# Patient Record
Sex: Male | Born: 1960 | Hispanic: No | Marital: Single | Smoking: Never smoker
Health system: Southern US, Community
[De-identification: ages and names within clinical notes are randomized; demographics above are authoritative.]

## PROBLEM LIST (undated history)

## (undated) DIAGNOSIS — E0590 Thyrotoxicosis, unspecified without thyrotoxic crisis or storm: Secondary | ICD-10-CM

## (undated) DIAGNOSIS — I10 Essential (primary) hypertension: Secondary | ICD-10-CM

## (undated) DIAGNOSIS — F79 Unspecified intellectual disabilities: Secondary | ICD-10-CM

## (undated) HISTORY — PX: NO PAST SURGERIES: SHX2092

---

## 2005-09-03 ENCOUNTER — Ambulatory Visit: Payer: Self-pay | Admitting: Oncology

## 2007-12-14 ENCOUNTER — Ambulatory Visit: Payer: Self-pay | Admitting: Oncology

## 2016-04-06 ENCOUNTER — Emergency Department: Payer: Medicaid Other

## 2016-04-06 ENCOUNTER — Encounter: Payer: Self-pay | Admitting: Emergency Medicine

## 2016-04-06 ENCOUNTER — Inpatient Hospital Stay
Admission: EM | Admit: 2016-04-06 | Discharge: 2016-04-07 | DRG: 872 | Disposition: A | Discharge status: Home / Self Care | Payer: Medicaid Other | Attending: Internal Medicine | Admitting: Internal Medicine

## 2016-04-06 DIAGNOSIS — F79 Unspecified intellectual disabilities: Secondary | ICD-10-CM | POA: Diagnosis present

## 2016-04-06 DIAGNOSIS — L03115 Cellulitis of right lower limb: Secondary | ICD-10-CM | POA: Diagnosis present

## 2016-04-06 DIAGNOSIS — A419 Sepsis, unspecified organism: Principal | ICD-10-CM | POA: Diagnosis present

## 2016-04-06 DIAGNOSIS — L03116 Cellulitis of left lower limb: Secondary | ICD-10-CM | POA: Diagnosis present

## 2016-04-06 DIAGNOSIS — Z79899 Other long term (current) drug therapy: Secondary | ICD-10-CM

## 2016-04-06 DIAGNOSIS — E872 Acidosis: Secondary | ICD-10-CM | POA: Diagnosis present

## 2016-04-06 DIAGNOSIS — I10 Essential (primary) hypertension: Secondary | ICD-10-CM | POA: Diagnosis present

## 2016-04-06 DIAGNOSIS — Z91013 Allergy to seafood: Secondary | ICD-10-CM | POA: Diagnosis not present

## 2016-04-06 DIAGNOSIS — E871 Hypo-osmolality and hyponatremia: Secondary | ICD-10-CM | POA: Diagnosis present

## 2016-04-06 DIAGNOSIS — M7989 Other specified soft tissue disorders: Secondary | ICD-10-CM | POA: Diagnosis not present

## 2016-04-06 DIAGNOSIS — L03119 Cellulitis of unspecified part of limb: Secondary | ICD-10-CM

## 2016-04-06 DIAGNOSIS — R7989 Other specified abnormal findings of blood chemistry: Secondary | ICD-10-CM | POA: Diagnosis present

## 2016-04-06 HISTORY — DX: Unspecified intellectual disabilities: F79

## 2016-04-06 HISTORY — DX: Essential (primary) hypertension: I10

## 2016-04-06 LAB — URINALYSIS, ROUTINE W REFLEX MICROSCOPIC
Bilirubin Urine: NEGATIVE
Glucose, UA: NEGATIVE mg/dL
Hgb urine dipstick: NEGATIVE
Ketones, ur: 20 mg/dL — AB
LEUKOCYTES UA: NEGATIVE
Nitrite: NEGATIVE
PROTEIN: NEGATIVE mg/dL
Specific Gravity, Urine: 1.015 (ref 1.005–1.030)
pH: 6 (ref 5.0–8.0)

## 2016-04-06 LAB — COMPREHENSIVE METABOLIC PANEL
ALT: 17 U/L (ref 17–63)
AST: 34 U/L (ref 15–41)
Albumin: 3.7 g/dL (ref 3.5–5.0)
Alkaline Phosphatase: 129 U/L — ABNORMAL HIGH (ref 38–126)
Anion gap: 12 (ref 5–15)
BUN: 16 mg/dL (ref 6–20)
CO2: 24 mmol/L (ref 22–32)
CREATININE: 1.12 mg/dL (ref 0.61–1.24)
Calcium: 9.1 mg/dL (ref 8.9–10.3)
Chloride: 96 mmol/L — ABNORMAL LOW (ref 101–111)
GFR calc Af Amer: >60 mL/min (ref >60)
Glucose, Bld: 163 mg/dL — ABNORMAL HIGH (ref 65–99)
POTASSIUM: 3.5 mmol/L (ref 3.5–5.1)
Sodium: 132 mmol/L — ABNORMAL LOW (ref 135–145)
TOTAL PROTEIN: 8.2 g/dL — AB (ref 6.5–8.1)
Total Bilirubin: 1 mg/dL (ref 0.3–1.2)

## 2016-04-06 LAB — CBC WITH DIFFERENTIAL/PLATELET
BASOS ABS: 0.1 10*3/uL (ref 0–0.1)
Basophils Relative: 0 %
EOS ABS: 1.4 10*3/uL — AB (ref 0–0.7)
EOS PCT: 9 %
HCT: 35 % — ABNORMAL LOW (ref 40.0–52.0)
Hemoglobin: 11.8 g/dL — ABNORMAL LOW (ref 13.0–18.0)
LYMPHS PCT: 8 %
Lymphs Abs: 1.3 10*3/uL (ref 1.0–3.6)
MCH: 26.7 pg (ref 26.0–34.0)
MCHC: 33.6 g/dL (ref 32.0–36.0)
MCV: 79.4 fL — ABNORMAL LOW (ref 80.0–100.0)
Monocytes Absolute: 1.7 10*3/uL — ABNORMAL HIGH (ref 0.2–1.0)
Monocytes Relative: 10 %
Neutro Abs: 11.8 10*3/uL — ABNORMAL HIGH (ref 1.4–6.5)
Neutrophils Relative %: 73 %
PLATELETS: 754 10*3/uL — AB (ref 150–440)
RBC: 4.41 MIL/uL (ref 4.40–5.90)
RDW: 14.6 % — ABNORMAL HIGH (ref 11.5–14.5)
WBC: 16.3 10*3/uL — AB (ref 3.8–10.6)

## 2016-04-06 LAB — LACTIC ACID, PLASMA
Lactic Acid, Venous: 3.1 mmol/L (ref 0.5–1.9)
Lactic Acid, Venous: 1.7 mmol/L (ref 0.5–1.9)

## 2016-04-06 LAB — APTT: aPTT: 34 seconds (ref 24–36)

## 2016-04-06 LAB — PROCALCITONIN

## 2016-04-06 LAB — BRAIN NATRIURETIC PEPTIDE: B NATRIURETIC PEPTIDE 5: 13 pg/mL (ref 0.0–100.0)

## 2016-04-06 LAB — PROTIME-INR
INR: 1.13
PROTHROMBIN TIME: 14.6 s (ref 11.4–15.2)

## 2016-04-06 LAB — TROPONIN I

## 2016-04-06 MED ORDER — ALBUTEROL SULFATE (2.5 MG/3ML) 0.083% IN NEBU: 2.5000 mg | INHALATION_SOLUTION | RESPIRATORY_TRACT | Status: DC | PRN

## 2016-04-06 MED ORDER — PIPERACILLIN-TAZOBACTAM 3.375 G IVPB
3.3750 g | Freq: Three times a day (TID) | INTRAVENOUS | Status: DC
  Administered 2016-04-06 – 2016-04-07 (×2): 3.375 g via INTRAVENOUS
  Filled 2016-04-06 (×2): qty 50

## 2016-04-06 MED ORDER — BISACODYL 5 MG PO TBEC: 5.0000 mg | DELAYED_RELEASE_TABLET | Freq: Every day | ORAL | Status: DC | PRN

## 2016-04-06 MED ORDER — AMLODIPINE BESYLATE 10 MG PO TABS
10.0000 mg | ORAL_TABLET | Freq: Every day | ORAL | Status: DC
  Administered 2016-04-07: 10 mg via ORAL
  Filled 2016-04-06: qty 1

## 2016-04-06 MED ORDER — SODIUM CHLORIDE 0.9 % IV SOLN
INTRAVENOUS | Status: DC
  Administered 2016-04-06: 1000 mL via INTRAVENOUS

## 2016-04-06 MED ORDER — PIPERACILLIN-TAZOBACTAM 3.375 G IVPB 30 MIN
3.3750 g | Freq: Once | INTRAVENOUS | Status: AC
  Administered 2016-04-06: 3.375 g via INTRAVENOUS
  Filled 2016-04-06: qty 50

## 2016-04-06 MED ORDER — HYDROCODONE-ACETAMINOPHEN 5-325 MG PO TABS: 1.0000 | ORAL_TABLET | ORAL | Status: DC | PRN

## 2016-04-06 MED ORDER — ONDANSETRON HCL 4 MG PO TABS: 4.0000 mg | ORAL_TABLET | Freq: Four times a day (QID) | ORAL | Status: DC | PRN

## 2016-04-06 MED ORDER — ENOXAPARIN SODIUM 40 MG/0.4ML ~~LOC~~ SOLN
40.0000 mg | Freq: Two times a day (BID) | SUBCUTANEOUS | Status: DC
  Administered 2016-04-06 – 2016-04-07 (×2): 40 mg via SUBCUTANEOUS
  Filled 2016-04-06 (×2): qty 0.4

## 2016-04-06 MED ORDER — ONDANSETRON HCL 4 MG/2ML IJ SOLN: 4.0000 mg | Freq: Four times a day (QID) | INTRAMUSCULAR | Status: DC | PRN

## 2016-04-06 MED ORDER — ACETAMINOPHEN 325 MG PO TABS: 650.0000 mg | ORAL_TABLET | Freq: Four times a day (QID) | ORAL | Status: DC | PRN

## 2016-04-06 MED ORDER — SODIUM CHLORIDE 0.9 % IV BOLUS (SEPSIS)
1000.0000 mL | Freq: Once | INTRAVENOUS | Status: AC
  Administered 2016-04-06: 1000 mL via INTRAVENOUS

## 2016-04-06 MED ORDER — SENNOSIDES-DOCUSATE SODIUM 8.6-50 MG PO TABS: 1.0000 | ORAL_TABLET | Freq: Every evening | ORAL | Status: DC | PRN

## 2016-04-06 MED ORDER — ACETAMINOPHEN 650 MG RE SUPP: 650.0000 mg | Freq: Four times a day (QID) | RECTAL | Status: DC | PRN

## 2016-04-06 MED ORDER — VANCOMYCIN HCL IN DEXTROSE 1-5 GM/200ML-% IV SOLN
1000.0000 mg | Freq: Once | INTRAVENOUS | Status: AC
  Administered 2016-04-06: 1000 mg via INTRAVENOUS
  Filled 2016-04-06: qty 200

## 2016-04-06 MED ORDER — VANCOMYCIN HCL 10 G IV SOLR
1250.0000 mg | Freq: Two times a day (BID) | INTRAVENOUS | Status: DC
  Administered 2016-04-07: 1250 mg via INTRAVENOUS
  Filled 2016-04-06 (×5): qty 1250

## 2016-04-06 MED ORDER — LISINOPRIL 20 MG PO TABS
40.0000 mg | ORAL_TABLET | Freq: Every day | ORAL | Status: DC
  Administered 2016-04-07: 40 mg via ORAL
  Filled 2016-04-06: qty 2

## 2016-04-06 MED ORDER — KETOROLAC TROMETHAMINE 30 MG/ML IJ SOLN: 30.0000 mg | Freq: Four times a day (QID) | INTRAMUSCULAR | Status: DC | PRN

## 2016-04-06 NOTE — Progress Notes (Signed)
PHARMACIST - PHYSICIAN COMMUNICATION  CONCERNING:  Enoxaparin (Lovenox) for DVT Prophylaxis    RECOMMENDATION: Patient was prescribed enoxaprin 40mg  q24 hours for VTE prophylaxis.   Filed Weights   04/06/16 1639  Weight: 286 lb (129.7 kg)    Body mass index is 47.59 kg/m.  Estimated Creatinine Clearance: 94.7 mL/min (by C-G formula based on SCr of 1.12 mg/dL).   [facility] patient is candidate for enoxaparin 40mg  every 12 hour dosing due to BMI being >40.  DESCRIPTION: Pharmacy has adjusted enoxaparin dose per [facility] policy.  Patient is now receiving enoxaparin 40mg  every 12 hours.   Cher NakaiSheema Elisama Thissen, PharmD Clinical Pharmacist  04/06/2016 9:23 PM

## 2016-04-06 NOTE — Progress Notes (Signed)
Pharmacy Antibiotic Note  Joel Norton is a 56 y.o. male admitted on 04/06/2016 with sepsis and cellulitis.  Pharmacy has been consulted for Vancomycin and Zosyn dosing. Patient received Vancomycin 1gm IV and Zosyn 3.375 IV x1 dose in ED.   Plan: Ke: 0.082   VD: 61.6   T1/2: 8.5   DW: 88kg  Will start the patient on Vancomycin 1250mg  IV every 12 hours with 6 hours stack dosing. Trough prior to 5th dose.  Calculated trough at Css is 14.  Will start patient on Zosyn 3.375 IV EI every 8 hours.   Height: 5\' 5"  (165.1 cm) Weight: 286 lb (129.7 kg) IBW/kg (Calculated) : 61.5  Temp (24hrs), Avg:98.1 F (36.7 C), Min:98 F (36.7 C), Max:98.2 F (36.8 C)   Recent Labs Lab 04/06/16 1648 04/06/16 1959  WBC 16.3*  --   CREATININE 1.12  --   LATICACIDVEN 3.1* 1.7    Estimated Creatinine Clearance: 94.7 mL/min (by C-G formula based on SCr of 1.12 mg/dL).    Allergies  Allergen Reactions  . Shellfish Allergy     Antimicrobials this admission: 0114 vancomycin >>  0114 Zosyn  >>   Dose adjustments this admission:   Microbiology results: 0114 BCx: sent 0114 UCx: sent   Thank you for allowing pharmacy to be a part of this patient's care.  Gardner CandleSheema M Rosely Fernandez, PharmD, BCPS Clinical Pharmacist 04/06/2016 9:30 PM

## 2016-04-06 NOTE — H&P (Addendum)
[facility] [place] [facility]   PATIENT NAME: Joel Norton    MR#:  [id number]  DATE OF BIRTH:  1960-09-30  DATE OF ADMISSION:  04/06/2016  PRIMARY CARE PHYSICIAN: Lanier Ensign KEY, MD   REQUESTING/REFERRING PHYSICIAN: Minna Antis, MD  CHIEF COMPLAINT:  No chief complaint on file.  Bilateral leg redness and swelling. HISTORY OF PRESENT ILLNESS:  Collins Kerby  is a 56 y.o. male with a known history of Hypertension and mental retardation. The patient was sent from home to ED due to above chief complaints. Patient has mental retardation, unable to provide information on review of system. According to his niece, POA, she noticed bilateral leg swelling and redness today. But no fever or chills. Patient was found tachycardia at 150 and leukocytosis at 16,300 in the ED. He is treated with vancomycin and Zosyn.  PAST MEDICAL HISTORY:   Past Medical History:  Diagnosis Date  . Hypertension   . Mental retardation     PAST SURGICAL HISTORY:  History reviewed. No pertinent surgical history. Unable to obtain due to patient's mental status. SOCIAL HISTORY:   Social History  Substance Use Topics  . Smoking status: Never Smoker  . Smokeless tobacco: Never Used  . Alcohol use No    FAMILY HISTORY:  History reviewed. No pertinent family history. Unable to obtain due to patient's mental status.  DRUG ALLERGIES:   Allergies  Allergen Reactions  . Shellfish Allergy     REVIEW OF SYSTEMS:   Review of Systems  Unable to perform ROS: Mental acuity    MEDICATIONS AT HOME:   Prior to Admission medications   Medication Sig Start Date End Date Taking? Authorizing Provider  amLODipine (NORVASC) 10 MG tablet Take 10 mg by mouth daily.   Yes Historical Provider, MD  hydrochlorothiazide (HYDRODIURIL) 25 MG tablet Take 25 mg by mouth daily.   Yes Historical Provider, MD  lisinopril (PRINIVIL,ZESTRIL) 40 MG tablet Take 40 mg by mouth daily.   Yes Historical  Provider, MD  Multiple Vitamin (MULTIVITAMIN) tablet Take 1 tablet by mouth daily.   Yes Historical Provider, MD      VITAL SIGNS:  Blood pressure (!) 133/51, pulse (!) 137, temperature 98.2 F (36.8 C), temperature source Oral, resp. rate (!) 26, height 5\' 5"  (1.651 m), weight 286 lb (129.7 kg), SpO2 100 %.  PHYSICAL EXAMINATION:  Physical Exam  GENERAL:  56 y.o.-year-old patient lying in the bed with no acute distress. Morbidly obese. EYES: Pupils equal, round, reactive to light and accommodation. No scleral icterus. Extraocular muscles intact.  HEENT: Head atraumatic, normocephalic. Oropharynx and nasopharynx clear.  NECK:  Supple, no jugular venous distention. No thyroid enlargement, no tenderness.  LUNGS: Normal breath sounds bilaterally, no wheezing, rales,rhonchi or crepitation. No use of accessory muscles of respiration.  CARDIOVASCULAR: S1, S2 normal. No murmurs, rubs, or gallops.  ABDOMEN: Soft, nontender, nondistended. Bowel sounds present. No organomegaly or mass.  EXTREMITIES: Bilateral leg redness and erythema under knees. No cyanosis, or clubbing.  NEUROLOGIC: Cranial nerves II through XII are intact. Muscle strength 5/5 in all extremities. Sensation intact. Gait not checked.  PSYCHIATRIC: The patient is demented. SKIN: No obvious rash, lesion, or ulcer.   LABORATORY PANEL:   CBC  Recent Labs Lab 04/06/16 1648  WBC 16.3*  HGB 11.8*  HCT 35.0*  PLT 754*   ------------------------------------------------------------------------------------------------------------------  Chemistries   Recent Labs Lab 04/06/16 1648  NA 132*  K 3.5  CL 96*  CO2 24  GLUCOSE  163*  BUN 16  CREATININE 1.12  CALCIUM 9.1  AST 34  ALT 17  ALKPHOS 129*  BILITOT 1.0   ------------------------------------------------------------------------------------------------------------------  Cardiac Enzymes  Recent Labs Lab 04/06/16 1648  TROPONINI <0.03    ------------------------------------------------------------------------------------------------------------------  RADIOLOGY:  [place] Venous Img Lower Bilateral  Result Date: 04/06/2016 CLINICAL DATA:  Redness and swelling in BILATERAL lower extremities for 1 day EXAM: BILATERAL LOWER EXTREMITY VENOUS DOPPLER ULTRASOUND TECHNIQUE: Gray-scale sonography with graded compression, as well as color Doppler and duplex ultrasound were performed to evaluate the lower extremity deep venous systems from the level of the common femoral vein and including the common femoral, femoral, profunda femoral, popliteal and calf veins including the posterior tibial, peroneal and gastrocnemius veins when visible. The superficial great saphenous vein was also interrogated. Spectral Doppler was utilized to evaluate flow at rest and with distal augmentation maneuvers in the common femoral, femoral and popliteal veins. COMPARISON:  None FINDINGS: RIGHT LOWER EXTREMITY Common Femoral Vein: No evidence of thrombus. Normal compressibility, respiratory phasicity and response to augmentation. Saphenofemoral Junction: No evidence of thrombus. Normal compressibility and flow on color Doppler imaging. Profunda Femoral Vein: No evidence of thrombus. Normal compressibility and flow on color Doppler imaging. Femoral Vein: No evidence of thrombus. Normal compressibility, respiratory phasicity and response to augmentation. Popliteal Vein: No evidence of thrombus. Normal compressibility, respiratory phasicity and response to augmentation. Calf Veins: No evidence of thrombus. Normal compressibility and flow on color Doppler imaging. Superficial Great Saphenous Vein: No evidence of thrombus. Normal compressibility and flow on color Doppler imaging. Venous Reflux:  None. Other Findings: RIGHT inguinal lymph nodes measuring 1.6 cm and 1.3 cm in short axis. LEFT LOWER EXTREMITY Common Femoral Vein: No evidence of thrombus. Normal compressibility,  respiratory phasicity and response to augmentation. Saphenofemoral Junction: No evidence of thrombus. Normal compressibility and flow on color Doppler imaging. Profunda Femoral Vein: No evidence of thrombus. Normal compressibility and flow on color Doppler imaging. Femoral Vein: No evidence of thrombus. Normal compressibility, respiratory phasicity and response to augmentation. Popliteal Vein: No evidence of thrombus. Normal compressibility, respiratory phasicity and response to augmentation. Calf Veins: Limited visualization of LEFT peroneal veins. Posterior tibial veins visualized. No evidence of thrombus. Normal compressibility and flow on color Doppler imaging. Superficial Great Saphenous Vein: No evidence of thrombus. Normal compressibility and flow on color Doppler imaging. Venous Reflux:  None. Other Findings: LEFT inguinal lymph nodes measuring 1.2 cm and 1.1 cm in short axis. IMPRESSION: No evidence of deep venous thrombosis in either lower extremity. Upper normal sized RIGHT inguinal lymph node. Electronically Signed   By: Ulyses SouthwardMark  Boles M.D.   On: 04/06/2016 17:56   Dg Chest Port 1 View  Result Date: 04/06/2016 CLINICAL DATA:  Bilateral lower limb swelling starting today. EXAM: PORTABLE CHEST 1 VIEW COMPARISON:  None. FINDINGS: 1655 hours. The lungs are clear wiithout focal pneumonia, edema, pneumothorax or pleural effusion. Cardiopericardial silhouette is at upper limits of normal for size. The visualized bony structures of the thorax are intact. Telemetry leads overlie the chest. IMPRESSION: No active disease. Electronically Signed   By: Kennith CenterEric  Mansell M.D.   On: 04/06/2016 17:11      IMPRESSION AND PLAN:   Sepsis due to bilateral leg cellulitis The patient will be admitted to medical floor. He is treated with vancomycin and Zosyn. Continue antibiotics, follow-up CBC and blood culture.  Lactic acidosis. Continue antibiotics and IV fluid support, follow-up lactic acid. Thrombocytosis. Due to  reaction. Follow-up CBC. Hyponatremia. Start normal saline IV and  follow-up BMP. Hypertension. Continue lisinopril and Norvasc, hold HCTZ due to hyponatremia.  All the records are reviewed and case discussed with ED provider. Management plans discussed with the patient, His niece and they are in agreement.  CODE STATUS: Full code  TOTAL TIME TAKING CARE OF THIS PATIENT: 53 minutes.    Shaune Pollack M.D on 04/06/2016 at 7:03 PM  Between 7am to 6pm - Pager - [phone]  After 6pm go to [email or web address] - Social research officer, government  [facility]  Office  [phone]  CC: Primary care physician; Lanier Ensign KEY, MD   Note: This dictation was prepared with Dragon dictation along with smaller phrase technology. Any transcriptional errors that result from this process are unintentional.

## 2016-04-06 NOTE — ED Provider Notes (Signed)
[facility] Emergency Department Provider Note  Time seen: 4:48 PM  I have reviewed the triage vital signs and the nursing notes.   HISTORY  Chief Complaint No chief complaint on file.    HPI Joel Norton is a 56 y.o. male with a past medical history of hypertension, decreased mental capacity, his niece who is the patient's primary caregiver is here with the patient who reports leg swelling and redness. According to the niece they just noticed the redness today but is not entirely positive how long the redness has been present. Patient's heart rate was 127 home, 150 in the emergency department. Denies any fever. Patient denies any complaints at this time.  Past Medical History:  Diagnosis Date  . Hypertension     There are no active problems to display for this patient.   History reviewed. No pertinent surgical history.  Prior to Admission medications   Not on File    Allergies  Allergen Reactions  . Shellfish Allergy     History reviewed. No pertinent family history.  Social History Social History  Substance Use Topics  . Smoking status: Never Smoker  . Smokeless tobacco: Never Used  . Alcohol use No    Review of Systems Per patient and niece. Constitutional: Negative for fever Cardiovascular: Negative for chest pain. Respiratory: Negative for shortness of breath. Gastrointestinal: Negative for abdominal pain, vomiting and diarrhea. Skin: Rash to lower legs bilaterally. Neurological: Negative for headache 10-point ROS otherwise negative.  ____________________________________________   PHYSICAL EXAM:  VITAL SIGNS: ED Triage Vitals  Enc Vitals Group     BP 04/06/16 1630 136/83     Pulse Rate 04/06/16 1630 (!) 153     Resp 04/06/16 1630 18     Temp 04/06/16 1630 98.2 F (36.8 C)     Temp Source 04/06/16 1630 Oral     SpO2 04/06/16 1630 100 %     Weight 04/06/16 1639 286 lb (129.7 kg)     Height 04/06/16 1639 5\' 3"  (1.6 m)    Head Circumference --      Peak Flow --      Pain Score --      Pain Loc --      Pain Edu? --      Excl. in GC? --     Constitutional: Alert. Well appearing and in no distress. Eyes: Normal exam ENT   Head: Normocephalic and atraumatic.   Mouth/Throat: Mucous membranes are moist. Cardiovascular: Regular rhythm around 150 bpm. Respiratory: Normal respiratory effort without tachypnea nor retractions. Breath sounds are clear  Gastrointestinal: Soft and nontender. No distention.   Musculoskeletal: 2+ lower extremity edema, equal bilaterally with significant erythema bilaterally. No tenderness to palpation. Appears to be neurovascular intact. Neurologic:  Normal speech and language. No gross focal neurologic deficits  Skin:  Skin is warm, dry and intact. Erythema bilateral lower extremities as noted above. Patient also has rash to his upper extremities and upper back which appeared to be possible bite marks (possible scabies, bedbugs) Psychiatric: Mood and affect are normal.   ____________________________________________    EKG  EKG reviewed and interpreted by myself shows sinus tachycardia 144 bpm. Narrow QRS, normal axis, normal intervals and nonspecific ST changes but no ST elevations.  ____________________________________________    RADIOLOGY  Ultrasound negative for DVT  ____________________________________________   INITIAL IMPRESSION / ASSESSMENT AND PLAN / ED COURSE  Pertinent labs & imaging results that were available during my care of the patient were reviewed  by me and considered in my medical decision making (see chart for details).  Patient presents the emergency department with bilateral lower extremity edema and erythema likely cellulitis we'll obtain an ultrasound to rule out DVTs. Patient is quite tachycardic at 153 bpm. I have ordered sepsis protocols, including troponin and BNP. We'll continue to closely monitor in the emergency department while  awaiting all results.  Patient is results show a lactate of 3.9. Ultrasounds are negative for DVT. Highly suspect cellulitis. White blood cell count of 16,000. Positive for sepsis criteria. We are treating with empiric anabiotic small admit to the hospitalist team for further treatment.  CRITICAL CARE Performed by: Minna AntisPADUCHOWSKI, Ninamarie Keel   Total critical care time: 30 minutes  Critical care time was exclusive of separately billable procedures and treating other patients.  Critical care was necessary to treat or prevent imminent or life-threatening deterioration.  Critical care was time spent personally by me on the following activities: development of treatment plan with patient and/or surrogate as well as nursing, discussions with consultants, evaluation of patient's response to treatment, examination of patient, obtaining history from patient or surrogate, ordering and performing treatments and interventions, ordering and review of laboratory studies, ordering and review of radiographic studies, pulse oximetry and re-evaluation of patient's condition.   ____________________________________________   FINAL CLINICAL IMPRESSION(S) / ED DIAGNOSES  Cellulitis Sepsis   Minna AntisKevin Imari Reen, MD 04/06/16 1840

## 2016-04-06 NOTE — ED Triage Notes (Signed)
Pt noticed cellulitis and swelling  bi-lateral legs. Per niece who is his caregiver his HR at home was in the 120's. Pt has hx of high BP.

## 2016-04-06 NOTE — ED Notes (Addendum)
FIRST NURSE NOTE: Swelling noted to both legs that started today.  Pt recently started on new medication in November.  Pt has some developmental disabilities and is accompanied by his niece. Who is his guardian.

## 2016-04-06 NOTE — ED Notes (Signed)
Patient transported to X-ray 

## 2016-04-07 LAB — BASIC METABOLIC PANEL
Anion gap: 10 (ref 5–15)
BUN: 16 mg/dL (ref 6–20)
CALCIUM: 8.5 mg/dL — AB (ref 8.9–10.3)
CHLORIDE: 99 mmol/L — AB (ref 101–111)
CO2: 27 mmol/L (ref 22–32)
CREATININE: 1.19 mg/dL (ref 0.61–1.24)
Glucose, Bld: 114 mg/dL — ABNORMAL HIGH (ref 65–99)
Potassium: 3.5 mmol/L (ref 3.5–5.1)
SODIUM: 136 mmol/L (ref 135–145)

## 2016-04-07 LAB — CBC
HCT: 30.4 % — ABNORMAL LOW (ref 40.0–52.0)
Hemoglobin: 10.4 g/dL — ABNORMAL LOW (ref 13.0–18.0)
MCH: 27.3 pg (ref 26.0–34.0)
MCHC: 34.4 g/dL (ref 32.0–36.0)
MCV: 79.5 fL — AB (ref 80.0–100.0)
PLATELETS: 566 10*3/uL — AB (ref 150–440)
RBC: 3.82 MIL/uL — AB (ref 4.40–5.90)
RDW: 14.1 % (ref 11.5–14.5)
WBC: 11.7 10*3/uL — ABNORMAL HIGH (ref 3.8–10.6)

## 2016-04-07 MED ORDER — CEPHALEXIN 500 MG PO CAPS: 500.0000 mg | ORAL_CAPSULE | Freq: Two times a day (BID) | ORAL | 0 refills | Status: DC

## 2016-04-07 NOTE — Progress Notes (Signed)
Discharge instructions and prescriptions given with verbalized understanding from caregiver the niece.  IV removed per policy and procedure. Patient taken out via wheelchair by nurse to be taken home in personal vehicle by family member.

## 2016-04-07 NOTE — Discharge Instructions (Signed)
Cellulitis, Adult °Introduction °Cellulitis is a skin infection. The infected area is usually red and sore. This condition occurs most often in the arms and lower legs. It is very important to get treated for this condition. °Follow these instructions at home: °· Take over-the-counter and prescription medicines only as told by your doctor. °· If you were prescribed an antibiotic medicine, take it as told by your doctor. Do not stop taking the antibiotic even if you start to feel better. °· Drink enough fluid to keep your pee (urine) clear or pale yellow. °· Do not touch or rub the infected area. °· Raise (elevate) the infected area above the level of your heart while you are sitting or lying down. °· Place warm or cold wet cloths (warm or cold compresses) on the infected area. Do this as told by your doctor. °· Keep all follow-up visits as told by your doctor. This is important. These visits let your doctor make sure your infection is not getting worse. °Contact a doctor if: °· You have a fever. °· Your symptoms do not get better after 1-2 days of treatment. °· Your bone or joint under the infected area starts to hurt after the skin has healed. °· Your infection comes back. This can happen in the same area or another area. °· You have a swollen bump in the infected area. °· You have new symptoms. °· You feel ill and also have muscle aches and pains. °Get help right away if: °· Your symptoms get worse. °· You feel very sleepy. °· You throw up (vomit) or have watery poop (diarrhea) for a long time. °· There are red streaks coming from the infected area. °· Your red area gets larger. °· Your red area turns darker. °This information is not intended to replace advice given to you by your health care provider. Make sure you discuss any questions you have with your health care provider. °Document Released: 08/27/2007 Document Revised: 08/16/2015 Document Reviewed: 01/17/2015 °© 2017 [employer] ° °

## 2016-04-08 LAB — URINE CULTURE: CULTURE: NO GROWTH

## 2016-04-09 NOTE — Discharge Summary (Signed)
[facility] - [place] at [facility]   PATIENT NAME: Joel Norton    MR#:  [id number]  DATE OF BIRTH:  Jan 20, 1961  DATE OF ADMISSION:  04/06/2016   ADMITTING PHYSICIAN: Shaune Pollack, MD  DATE OF DISCHARGE: 04/07/2016 10:56 AM  PRIMARY CARE PHYSICIAN: SOLES, MEREDITH KEY, MD   ADMISSION DIAGNOSIS:  Cellulitis of lower extremity, unspecified laterality [L03.119] Sepsis, due to unspecified organism (HCC) [A41.9] DISCHARGE DIAGNOSIS:  Active Problems:   Sepsis (HCC)  SECONDARY DIAGNOSIS:   Past Medical History:  Diagnosis Date  . Hypertension   . Mental retardation    HOSPITAL COURSE:  56 y.o. male with a known history of Hypertension and mental retardation  Sepsis due to bilateral leg cellulitis - present on admission Resolved with abx  Lactic acidosis: resolved with treatment Thrombocytosis. stable  Hyponatremia: improved with hydration, held HCTZ Hypertension. Continue lisinopril and Norvasc, hold HCTZ due to hyponatremia.  DISCHARGE CONDITIONS:  STABLE CONSULTS OBTAINED:   DRUG ALLERGIES:   Allergies  Allergen Reactions  . Shellfish Allergy    DISCHARGE MEDICATIONS:   Allergies as of 04/07/2016      Reactions   Shellfish Allergy       Medication List    TAKE these medications   amLODipine 10 MG tablet Commonly known as:  NORVASC Take 10 mg by mouth daily.   cephALEXin 500 MG capsule Commonly known as:  KEFLEX Take 1 capsule (500 mg total) by mouth 2 (two) times daily.   hydrochlorothiazide 25 MG tablet Commonly known as:  HYDRODIURIL Take 25 mg by mouth daily.   lisinopril 40 MG tablet Commonly known as:  PRINIVIL,ZESTRIL Take 40 mg by mouth daily.   multivitamin tablet Take 1 tablet by mouth daily.        DISCHARGE INSTRUCTIONS:   DIET:  Regular diet DISCHARGE CONDITION:  Good ACTIVITY:  Activity as tolerated OXYGEN:  Home Oxygen: Yes.    Oxygen Delivery: room air DISCHARGE LOCATION:  home   If you experience  worsening of your admission symptoms, develop shortness of breath, life threatening emergency, suicidal or homicidal thoughts you must seek medical attention immediately by calling 911 or calling your MD immediately  if symptoms less severe.  You Must read complete instructions/literature along with all the possible adverse reactions/side effects for all the Medicines you take and that have been prescribed to you. Take any new Medicines after you have completely understood and accpet all the possible adverse reactions/side effects.   Please note  You were cared for by a hospitalist during your hospital stay. If you have any questions about your discharge medications or the care you received while you were in the hospital after you are discharged, you can call the unit and asked to speak with the hospitalist on call if the hospitalist that took care of you is not available. Once you are discharged, your primary care physician will handle any further medical issues. Please note that NO REFILLS for any discharge medications will be authorized once you are discharged, as it is imperative that you return to your primary care physician (or establish a relationship with a primary care physician if you do not have one) for your aftercare needs so that they can reassess your need for medications and monitor your lab values.    On the day of Discharge:  VITAL SIGNS:  Blood pressure 130/64, pulse (!) 111, temperature 98.4 F (36.9 C), temperature source Oral, resp. rate 17, height 5\' 5"  (1.651 m), weight 129.7  kg (286 lb), SpO2 100 %. PHYSICAL EXAMINATION:  GENERAL:  56 y.o.-year-old patient lying in the bed with no acute distress.  EYES: Pupils equal, round, reactive to light and accommodation. No scleral icterus. Extraocular muscles intact.  HEENT: Head atraumatic, normocephalic. Oropharynx and nasopharynx clear.  NECK:  Supple, no jugular venous distention. No thyroid enlargement, no tenderness.  LUNGS:  Normal breath sounds bilaterally, no wheezing, rales,rhonchi or crepitation. No use of accessory muscles of respiration.  CARDIOVASCULAR: S1, S2 normal. No murmurs, rubs, or gallops.  ABDOMEN: Soft, non-tender, non-distended. Bowel sounds present. No organomegaly or mass.  EXTREMITIES: No pedal edema, cyanosis, or clubbing.  NEUROLOGIC: Cranial nerves II through XII are intact. Muscle strength 5/5 in all extremities. Sensation intact. Gait not checked.  PSYCHIATRIC: The patient is alert and oriented x 3.  SKIN: No obvious rash, lesion, or ulcer.  DATA REVIEW:   CBC  Recent Labs Lab 04/07/16 0320  WBC 11.7*  HGB 10.4*  HCT 30.4*  PLT 566*    Chemistries   Recent Labs Lab 04/06/16 1648 04/07/16 0320  NA 132* 136  K 3.5 3.5  CL 96* 99*  CO2 24 27  GLUCOSE 163* 114*  BUN 16 16  CREATININE 1.12 1.19  CALCIUM 9.1 8.5*  AST 34  --   ALT 17  --   ALKPHOS 129*  --   BILITOT 1.0  --     Follow-up Information    SOLES, MEREDITH KEY, MD. Schedule an appointment as soon as possible for a visit in 1 week(s).   Specialty:  Family Medicine Contact information: [address] [place] [address] [phone]           Management plans discussed with the patient, family and they are in agreement.  CODE STATUS: FULL CODE  TOTAL TIME TAKING CARE OF THIS PATIENT: 45 minutes.    Delfino LovettVipul Betzalel Umbarger M.D on 04/09/2016 at 10:01 PM  Between 7am to 6pm - Pager - [phone]  After 6pm go to [email or web address] - Social research officer, governmentpassword EPAS [facility]  [facility]  Hospitalists  Office  [phone]  CC: Primary care physician; Lanier EnsignSOLES, MEREDITH KEY, MD   Note: This dictation was prepared with Dragon dictation along with smaller phrase technology. Any transcriptional errors that result from this process are unintentional.

## 2016-04-11 LAB — CULTURE, BLOOD (ROUTINE X 2)
CULTURE: NO GROWTH
Culture: NO GROWTH

## 2016-04-29 ENCOUNTER — Inpatient Hospital Stay
Admission: EM | Admit: 2016-04-29 | Discharge: 2016-05-01 | DRG: 872 | Disposition: A | Discharge status: Home / Self Care | Payer: Medicaid Other | Attending: Internal Medicine | Admitting: Internal Medicine

## 2016-04-29 ENCOUNTER — Encounter: Payer: Self-pay | Admitting: *Deleted

## 2016-04-29 DIAGNOSIS — E871 Hypo-osmolality and hyponatremia: Secondary | ICD-10-CM | POA: Diagnosis present

## 2016-04-29 DIAGNOSIS — L03116 Cellulitis of left lower limb: Secondary | ICD-10-CM | POA: Diagnosis present

## 2016-04-29 DIAGNOSIS — L0390 Cellulitis, unspecified: Secondary | ICD-10-CM

## 2016-04-29 DIAGNOSIS — I10 Essential (primary) hypertension: Secondary | ICD-10-CM | POA: Diagnosis present

## 2016-04-29 DIAGNOSIS — E876 Hypokalemia: Secondary | ICD-10-CM | POA: Diagnosis present

## 2016-04-29 DIAGNOSIS — D509 Iron deficiency anemia, unspecified: Secondary | ICD-10-CM | POA: Diagnosis present

## 2016-04-29 DIAGNOSIS — Z6841 Body Mass Index (BMI) 40.0 and over, adult: Secondary | ICD-10-CM | POA: Diagnosis not present

## 2016-04-29 DIAGNOSIS — F79 Unspecified intellectual disabilities: Secondary | ICD-10-CM | POA: Diagnosis present

## 2016-04-29 DIAGNOSIS — Z792 Long term (current) use of antibiotics: Secondary | ICD-10-CM | POA: Diagnosis not present

## 2016-04-29 DIAGNOSIS — R2681 Unsteadiness on feet: Secondary | ICD-10-CM

## 2016-04-29 DIAGNOSIS — Z79899 Other long term (current) drug therapy: Secondary | ICD-10-CM | POA: Diagnosis not present

## 2016-04-29 DIAGNOSIS — I890 Lymphedema, not elsewhere classified: Secondary | ICD-10-CM | POA: Diagnosis present

## 2016-04-29 DIAGNOSIS — L309 Dermatitis, unspecified: Secondary | ICD-10-CM | POA: Diagnosis present

## 2016-04-29 DIAGNOSIS — E669 Obesity, unspecified: Secondary | ICD-10-CM | POA: Diagnosis present

## 2016-04-29 DIAGNOSIS — Z91013 Allergy to seafood: Secondary | ICD-10-CM | POA: Diagnosis not present

## 2016-04-29 DIAGNOSIS — L03115 Cellulitis of right lower limb: Secondary | ICD-10-CM | POA: Diagnosis present

## 2016-04-29 DIAGNOSIS — A419 Sepsis, unspecified organism: Principal | ICD-10-CM | POA: Diagnosis present

## 2016-04-29 LAB — COMPREHENSIVE METABOLIC PANEL
ALBUMIN: 3 g/dL — AB (ref 3.5–5.0)
ALK PHOS: 117 U/L (ref 38–126)
ALT: 36 U/L (ref 17–63)
ANION GAP: 7 (ref 5–15)
AST: 58 U/L — ABNORMAL HIGH (ref 15–41)
BUN: 16 mg/dL (ref 6–20)
CALCIUM: 8.2 mg/dL — AB (ref 8.9–10.3)
CHLORIDE: 100 mmol/L — AB (ref 101–111)
CO2: 26 mmol/L (ref 22–32)
Creatinine, Ser: 1.11 mg/dL (ref 0.61–1.24)
GFR calc non Af Amer: >60 mL/min (ref >60)
GLUCOSE: 159 mg/dL — AB (ref 65–99)
POTASSIUM: 3.4 mmol/L — AB (ref 3.5–5.1)
SODIUM: 133 mmol/L — AB (ref 135–145)
Total Bilirubin: 0.2 mg/dL — ABNORMAL LOW (ref 0.3–1.2)
Total Protein: 7.1 g/dL (ref 6.5–8.1)

## 2016-04-29 LAB — URINALYSIS, COMPLETE (UACMP) WITH MICROSCOPIC
BACTERIA UA: NONE SEEN
BILIRUBIN URINE: NEGATIVE
Glucose, UA: NEGATIVE mg/dL
HGB URINE DIPSTICK: NEGATIVE
Ketones, ur: 5 mg/dL — AB
LEUKOCYTES UA: NEGATIVE
Nitrite: NEGATIVE
PROTEIN: NEGATIVE mg/dL
RBC / HPF: NONE SEEN RBC/hpf (ref 0–5)
SPECIFIC GRAVITY, URINE: 1.013 (ref 1.005–1.030)
SQUAMOUS EPITHELIAL / LPF: NONE SEEN
pH: 7 (ref 5.0–8.0)

## 2016-04-29 LAB — CBC WITH DIFFERENTIAL/PLATELET
Basophils Absolute: 0.1 10*3/uL (ref 0–0.1)
Basophils Relative: 1 %
Eosinophils Absolute: 1.5 10*3/uL — ABNORMAL HIGH (ref 0–0.7)
Eosinophils Relative: 12 %
HEMATOCRIT: 30.1 % — AB (ref 40.0–52.0)
HEMOGLOBIN: 9.9 g/dL — AB (ref 13.0–18.0)
LYMPHS PCT: 10 %
Lymphs Abs: 1.2 10*3/uL (ref 1.0–3.6)
MCH: 26.4 pg (ref 26.0–34.0)
MCHC: 32.8 g/dL (ref 32.0–36.0)
MCV: 80.5 fL (ref 80.0–100.0)
MONO ABS: 1.2 10*3/uL — AB (ref 0.2–1.0)
MONOS PCT: 10 %
NEUTROS ABS: 8.5 10*3/uL — AB (ref 1.4–6.5)
NEUTROS PCT: 67 %
Platelets: 607 10*3/uL — ABNORMAL HIGH (ref 150–440)
RBC: 3.75 MIL/uL — ABNORMAL LOW (ref 4.40–5.90)
RDW: 15.2 % — AB (ref 11.5–14.5)
WBC: 12.5 10*3/uL — ABNORMAL HIGH (ref 3.8–10.6)

## 2016-04-29 LAB — LACTIC ACID, PLASMA: Lactic Acid, Venous: 2 mmol/L (ref 0.5–1.9)

## 2016-04-29 MED ORDER — HYDROCHLOROTHIAZIDE 25 MG PO TABS
25.0000 mg | ORAL_TABLET | Freq: Every day | ORAL | Status: DC
  Administered 2016-04-30 – 2016-05-01 (×2): 25 mg via ORAL
  Filled 2016-04-29 (×2): qty 1

## 2016-04-29 MED ORDER — PIPERACILLIN-TAZOBACTAM 3.375 G IVPB 30 MIN: 3.3750 g | Freq: Once | INTRAVENOUS | Status: DC

## 2016-04-29 MED ORDER — ADULT MULTIVITAMIN W/MINERALS CH
1.0000 | ORAL_TABLET | Freq: Every day | ORAL | Status: DC
  Administered 2016-04-30 – 2016-05-01 (×2): 1 via ORAL
  Filled 2016-04-29 (×2): qty 1

## 2016-04-29 MED ORDER — AMLODIPINE BESYLATE 5 MG PO TABS
10.0000 mg | ORAL_TABLET | Freq: Every day | ORAL | Status: DC
  Administered 2016-04-30 – 2016-05-01 (×2): 10 mg via ORAL
  Filled 2016-04-29 (×2): qty 2

## 2016-04-29 MED ORDER — ONDANSETRON HCL 4 MG PO TABS: 4.0000 mg | ORAL_TABLET | Freq: Four times a day (QID) | ORAL | Status: DC | PRN

## 2016-04-29 MED ORDER — ENOXAPARIN SODIUM 40 MG/0.4ML ~~LOC~~ SOLN
40.0000 mg | Freq: Two times a day (BID) | SUBCUTANEOUS | Status: DC
  Administered 2016-04-30 – 2016-05-01 (×3): 40 mg via SUBCUTANEOUS
  Filled 2016-04-29 (×3): qty 0.4

## 2016-04-29 MED ORDER — BISACODYL 5 MG PO TBEC: 5.0000 mg | DELAYED_RELEASE_TABLET | Freq: Every day | ORAL | Status: DC | PRN

## 2016-04-29 MED ORDER — SENNOSIDES-DOCUSATE SODIUM 8.6-50 MG PO TABS: 1.0000 | ORAL_TABLET | Freq: Every evening | ORAL | Status: DC | PRN

## 2016-04-29 MED ORDER — ACETAMINOPHEN 325 MG PO TABS: 650.0000 mg | ORAL_TABLET | Freq: Four times a day (QID) | ORAL | Status: DC | PRN

## 2016-04-29 MED ORDER — OXYCODONE HCL 5 MG PO TABS: 5.0000 mg | ORAL_TABLET | ORAL | Status: DC | PRN

## 2016-04-29 MED ORDER — ACETAMINOPHEN 650 MG RE SUPP
650.0000 mg | Freq: Four times a day (QID) | RECTAL | Status: DC | PRN
Start: 2016-04-29 — End: 2016-05-01

## 2016-04-29 MED ORDER — VANCOMYCIN HCL IN DEXTROSE 1-5 GM/200ML-% IV SOLN
1000.0000 mg | Freq: Once | INTRAVENOUS | Status: DC
  Filled 2016-04-29: qty 200

## 2016-04-29 MED ORDER — LISINOPRIL 20 MG PO TABS
40.0000 mg | ORAL_TABLET | Freq: Every day | ORAL | Status: DC
  Administered 2016-04-30 – 2016-05-01 (×2): 40 mg via ORAL
  Filled 2016-04-29 (×2): qty 2

## 2016-04-29 MED ORDER — ALBUTEROL SULFATE (2.5 MG/3ML) 0.083% IN NEBU: 2.5000 mg | INHALATION_SOLUTION | Freq: Four times a day (QID) | RESPIRATORY_TRACT | Status: DC | PRN

## 2016-04-29 MED ORDER — MAGNESIUM CITRATE PO SOLN
1.0000 | Freq: Once | ORAL | Status: DC | PRN
  Filled 2016-04-29: qty 296

## 2016-04-29 MED ORDER — VANCOMYCIN HCL IN DEXTROSE 1-5 GM/200ML-% IV SOLN: 1000.0000 mg | Freq: Once | INTRAVENOUS | Status: DC

## 2016-04-29 MED ORDER — SODIUM CHLORIDE 0.9 % IV BOLUS (SEPSIS)
1000.0000 mL | Freq: Once | INTRAVENOUS | Status: AC
Start: 2016-04-29 — End: 2016-04-29
  Administered 2016-04-29: 1000 mL via INTRAVENOUS

## 2016-04-29 MED ORDER — IPRATROPIUM BROMIDE 0.02 % IN SOLN: 0.5000 mg | Freq: Four times a day (QID) | RESPIRATORY_TRACT | Status: DC | PRN

## 2016-04-29 MED ORDER — SODIUM CHLORIDE 0.9 % IV SOLN
INTRAVENOUS | Status: DC
  Administered 2016-04-30: 100 mL/h via INTRAVENOUS

## 2016-04-29 MED ORDER — PIPERACILLIN-TAZOBACTAM 4.5 G IVPB
4.5000 g | Freq: Three times a day (TID) | INTRAVENOUS | Status: DC
  Administered 2016-04-30 (×2): 4.5 g via INTRAVENOUS
  Filled 2016-04-29 (×3): qty 100

## 2016-04-29 MED ORDER — PIPERACILLIN-TAZOBACTAM 3.375 G IVPB 30 MIN
3.3750 g | Freq: Once | INTRAVENOUS | Status: AC
  Administered 2016-04-29: 3.375 g via INTRAVENOUS
  Filled 2016-04-29: qty 50

## 2016-04-29 MED ORDER — VANCOMYCIN HCL 10 G IV SOLR
1500.0000 mg | Freq: Once | INTRAVENOUS | Status: AC
  Administered 2016-04-29: 1500 mg via INTRAVENOUS
  Filled 2016-04-29: qty 1500

## 2016-04-29 MED ORDER — ONDANSETRON HCL 4 MG/2ML IJ SOLN: 4.0000 mg | Freq: Four times a day (QID) | INTRAMUSCULAR | Status: DC | PRN

## 2016-04-29 MED ORDER — ZOLPIDEM TARTRATE 5 MG PO TABS: 5.0000 mg | ORAL_TABLET | Freq: Every evening | ORAL | Status: DC | PRN

## 2016-04-29 MED ORDER — VANCOMYCIN HCL 10 G IV SOLR
1500.0000 mg | Freq: Two times a day (BID) | INTRAVENOUS | Status: DC
  Administered 2016-04-30: 1500 mg via INTRAVENOUS
  Filled 2016-04-29 (×2): qty 1500

## 2016-04-29 NOTE — ED Triage Notes (Addendum)
Pt to ED reporting bilateral leg cellulitis that was dx in January and seen by a specialist. Pt finished prescribed PO Keflex that was given in ED last visit without improvement. Pt began new antibiotic (Sulfameth/TMP 800/160) today and family reports no improvement yet. Pt is tachycardic in triage but is able to walk without trouble and is alert at baseline per family.

## 2016-04-29 NOTE — Progress Notes (Signed)
Pharmacy Antibiotic Note  Joel Norton is a 56 y.o. male admitted on 04/29/2016 with cellulitis.  Pharmacy has been consulted for vancomycin and Zosyn dosing.  Plan: DW 91kg  Vd 63L kei 0.085 hr-1  T1/2 8 hours Vancomycin 1500 mg q 12 hours ordered with stacked dosing. Level before 5th dose. Goal trough 15-20.   Zosyn 4.5 grams q 8 hours ordered for TBW >120kg.  Height: 5\' 6"  (167.6 cm) Weight: 286 lb (129.7 kg) IBW/kg (Calculated) : 63.8  Temp (24hrs), Avg:98.8 F (37.1 C), Min:98.8 F (37.1 C), Max:98.8 F (37.1 C)   Recent Labs Lab 04/29/16 2019 04/29/16 2126  WBC 12.5*  --   CREATININE  --  1.11  LATICACIDVEN 2.0*  --     Estimated Creatinine Clearance: 97.1 mL/min (by C-G formula based on SCr of 1.11 mg/dL).    Allergies  Allergen Reactions  . Shellfish Allergy     Antimicrobials this admission: vancomycin 2/6 >>  Zosyn  2/6 >>   Dose adjustments this admission:   Microbiology results: 2/6 BCx: pending     2/6 UA: (-)  Thank you for allowing pharmacy to be a part of this patient's care.  Joeziah Voit S 04/29/2016 11:20 PM

## 2016-04-29 NOTE — H&P (Signed)
History and Physical   [facility] [place] [facility] Ad[facility] History and Physical [employer] Dock Baccam, D.O.    Patient Name: Joel MorosLee Porta MR#: [id number] Date of Birth: 03/29/1960 Date of Admission: 04/29/2016  Referring MD/NP/PA: Dr. Pershing ProudSchaevitz Primary Care Physician: Lanier EnsignSOLES, MEREDITH KEY, MD Patient coming from: Home  Chief Complaint: Cellulitis  HPI: Joel Norton is a 56 y.o. male with a known history of HTN, MR who has a recent computed history associated with bilateral lower extremity cellulitis. He was admitted to this hospital in January and placed on IV antibiotics for the same symptoms. He was discharged home and has been on Keflex and Bactrim (of which he only took 1 dose) since then for nonhealing lower extremity wounds. He comes in today with worsening symptoms of redness, swelling but the patient himself denies any pain. Patient denies fevers/chills, weakness, dizziness, chest pain, shortness of breath, N/V/C/D, abdominal pain, dysuria/frequency, changes in mental status.   Patient lives at home with his niece who is his primary caregiver. She states that they were instructed to put Vaseline on his wounds.   Otherwise there has been no change in status. Patient has been taking medication as prescribed and there has been no recent change in medication or diet.  No recent antibiotics.  There has been no recent illness, travel or sick contacts.    ED Course: Zosyn, Vanco, NS  Review of Systems:  CONSTITUTIONAL: No fever/chills, fatigue, weakness, weight gain/loss, headache. EYES: No blurry or double vision. ENT: No tinnitus, postnasal drip, redness or soreness of the oropharynx. RESPIRATORY: No cough, dyspnea, wheeze.  No hemoptysis.  CARDIOVASCULAR: No chest pain, palpitations, syncope, orthopnea. No lower extremity edema.  GASTROINTESTINAL: No nausea, vomiting, abdominal pain, diarrhea, constipation.  No hematemesis, melena or hematochezia. GENITOURINARY: No dysuria,  frequency, hematuria. ENDOCRINE: No polyuria or nocturia. No heat or cold intolerance. HEMATOLOGY: No anemia, bruising, bleeding. INTEGUMENTARY: No rashes, ulcers, lesions. Positive bilateral lower extremity cellulitis MUSCULOSKELETAL: No arthritis, gout, dyspnea. NEUROLOGIC: No numbness, tingling, ataxia, seizure-type activity, weakness. PSYCHIATRIC: No anxiety, depression, insomnia.   Past Medical History:  Diagnosis Date  . Hypertension   . Mental retardation     History reviewed. No pertinent surgical history.   reports that he has never smoked. He has never used smokeless tobacco. He reports that he does not drink alcohol or use drugs.  Allergies  Allergen Reactions  . Shellfish Allergy     History reviewed. No pertinent family history. Family history has been reviewed and confirmed with patient.   Prior to Admission medications   Medication Sig Start Date End Date Taking? Authorizing Provider  amLODipine (NORVASC) 10 MG tablet Take 10 mg by mouth daily.    Historical Provider, MD  cephALEXin (KEFLEX) 500 MG capsule Take 1 capsule (500 mg total) by mouth 2 (two) times daily. 04/07/16   Delfino LovettVipul Shah, MD  hydrochlorothiazide (HYDRODIURIL) 25 MG tablet Take 25 mg by mouth daily.    Historical Provider, MD  lisinopril (PRINIVIL,ZESTRIL) 40 MG tablet Take 40 mg by mouth daily.    Historical Provider, MD  Multiple Vitamin (MULTIVITAMIN) tablet Take 1 tablet by mouth daily.    Historical Provider, MD    Physical Exam: Vitals:   04/29/16 1930 04/29/16 2030 04/29/16 2130  BP: 123/72 115/68 113/66  Pulse: (!) 144 (!) 131 (!) 128  Resp: 16 (!) 25 (!) 26  Temp: 98.8 F (37.1 C)    TempSrc: Oral    SpO2: 100% 100% 100%  Weight: 129.7 kg (286  lb)    Height: 5\' 6"  (1.676 m)      GENERAL: 56 y.o.-year-old Black male patient, well-developed, well-nourished lying in the bed in no acute distress.  Pleasant and cooperative.   HEENT: Head atraumatic, normocephalic. Pupils equal,  round, reactive to light and accommodation. No scleral icterus. Extraocular muscles intact. Nares are patent. Oropharynx is clear. Mucus membranes moist. Very poor dentition NECK: Supple, full range of motion. No JVD, no bruit heard. No thyroid enlargement, no tenderness, no cervical lymphadenopathy. CHEST: Normal breath sounds bilaterally. No wheezing, rales, rhonchi or crackles. No use of accessory muscles of respiration.  No reproducible chest wall tenderness.  CARDIOVASCULAR: S1, S2 normal. No murmurs, rubs, or gallops. Cap refill <2 seconds. Pulses intact distally.  ABDOMEN: Soft, nondistended, nontender. No rebound, guarding, rigidity. Normoactive bowel sounds present in all four quadrants. No organomegaly or mass. EXTREMITIES: No pedal edema, cyanosis, or clubbing. No calf tenderness or Homan's sign.  NEUROLOGIC: The patient is alert and oriented x 3. Cranial nerves II through XII are grossly intact with no focal sensorimotor deficit. Muscle strength 5/5 in all extremities. Sensation intact. Gait not checked. SKIN: Warm, dry, and intact without obvious rash, lesion, or ulcer with the exception of bilateral lower extremity erythema distal to the knees bilaterally with mild nonpitting edema, diffuse scaling and patches of linear excoriation consistent with scratch marks.    Labs on Admission:  CBC:  Recent Labs Lab 04/29/16 2019  WBC 12.5*  NEUTROABS 8.5*  HGB 9.9*  HCT 30.1*  MCV 80.5  PLT 607*   Basic Metabolic Panel:  Recent Labs Lab 04/29/16 2126  NA 133*  K 3.4*  CL 100*  CO2 26  GLUCOSE 159*  BUN 16  CREATININE 1.11  CALCIUM 8.2*   GFR: Estimated Creatinine Clearance: 97.1 mL/min (by C-G formula based on SCr of 1.11 mg/dL). Liver Function Tests:  Recent Labs Lab 04/29/16 2126  AST 58*  ALT 36  ALKPHOS 117  BILITOT 0.2*  PROT 7.1  ALBUMIN 3.0*   No results for input(s): LIPASE, AMYLASE in the last 168 hours. No results for input(s): AMMONIA in the last  168 hours. Coagulation Profile: No results for input(s): INR, PROTIME in the last 168 hours. Cardiac Enzymes: No results for input(s): CKTOTAL, CKMB, CKMBINDEX, TROPONINI in the last 168 hours. BNP (last 3 results) No results for input(s): PROBNP in the last 8760 hours. HbA1C: No results for input(s): HGBA1C in the last 72 hours. CBG: No results for input(s): GLUCAP in the last 168 hours. Lipid Profile: No results for input(s): CHOL, HDL, LDLCALC, TRIG, CHOLHDL, LDLDIRECT in the last 72 hours. Thyroid Function Tests: No results for input(s): TSH, T4TOTAL, FREET4, T3FREE, THYROIDAB in the last 72 hours. Anemia Panel: No results for input(s): VITAMINB12, FOLATE, FERRITIN, TIBC, IRON, RETICCTPCT in the last 72 hours. Urine analysis:    Component Value Date/Time   COLORURINE YELLOW (A) 04/29/2016 2019   APPEARANCEUR CLEAR (A) 04/29/2016 2019   LABSPEC 1.013 04/29/2016 2019   PHURINE 7.0 04/29/2016 2019   GLUCOSEU NEGATIVE 04/29/2016 2019   HGBUR NEGATIVE 04/29/2016 2019   BILIRUBINUR NEGATIVE 04/29/2016 2019   KETONESUR 5 (A) 04/29/2016 2019   PROTEINUR NEGATIVE 04/29/2016 2019   NITRITE NEGATIVE 04/29/2016 2019   LEUKOCYTESUR NEGATIVE 04/29/2016 2019   Sepsis Labs: @LABRCNTIP (procalcitonin:4,lacticidven:4) )No results found for this or any previous visit (from the past 240 hour(s)).   Radiological Exams on Admission: No results found.  EKG: Sinus tachy at 141 bpm with normal axis and  nonspecific ST-T wave changes.   Assessment/Plan  This is a 56 y.o. male with a history of HTN, MR now being admitted with: 1. Sepsis secondary to cellulitis, failed outpatient therapy - Admit to inpatient  - IV antibiotics: Zosyn, Vanco - IV fluid hydration - Follow up blood,urine & sputum cultures - Repeat CBC in am.  - Infectious disease and wound care consultation has been requested  2. Anemia, acute, microcytic -Check iron studies -Check FOBT -Follow-up CBC in a.m.  3.  Hyponatremia, mild -IV fluid hydration and recheck BMP in a.m.  4. Hypokalemia, mild -By mouth replacement and recheck BMP in a.m.  5. History of hypertension -Continue Norvasc, Hydrocort thiazide, lisinopril  Admission status: Inpatient IV Fluids: NS Diet/Nutrition: HH Consults called: None  DVT Px: Lovenox, SCDs and early ambulation. Code Status: Full Code  Disposition Plan: To home in 1-2 days   All the records are reviewed and case discussed with ED provider. Management plans discussed with the patient and/or family who express understanding and agree with plan of care.  Cruise Baumgardner D.O. on 04/29/2016 at 10:39 PM Between 7am to 6pm - Pager - [phone] After 6pm go to [email or web address] - Social research officer, government [facility] [place] [facility] Office [phone] CC: Primary care physician; Lanier Ensign KEY, MD   04/29/2016, 10:39 PM

## 2016-04-29 NOTE — ED Provider Notes (Signed)
[facility] Emergency Department Provider Note  ____________________________________________   First MD Initiated Contact with Patient 04/29/16 1938     (approximate)  I have reviewed the triage vital signs and the nursing notes.   HISTORY  Chief Complaint Cellulitis   HPI Joel Norton is a 56 y.o. male history of hypertension and mental retardationis present. Emergency department with bilateral lower extremity swelling and redness. He has been having intermittent cellulitis over the past 2 weeks. He was admitted in mid January and placed on IV antibiotics. He was then transitioned to Keflex. He was just started on Bactrim yesterday and was given one dose but without any improvement. He is not reporting any chest pain or shortness of breath.   Past Medical History:  Diagnosis Date  . Hypertension   . Mental retardation     Patient Active Problem List   Diagnosis Date Noted  . Sepsis (HCC) 04/06/2016    History reviewed. No pertinent surgical history.  Prior to Admission medications   Medication Sig Start Date End Date Taking? Authorizing Provider  amLODipine (NORVASC) 10 MG tablet Take 10 mg by mouth daily.    Historical Provider, MD  cephALEXin (KEFLEX) 500 MG capsule Take 1 capsule (500 mg total) by mouth 2 (two) times daily. 04/07/16   Delfino LovettVipul Shah, MD  hydrochlorothiazide (HYDRODIURIL) 25 MG tablet Take 25 mg by mouth daily.    Historical Provider, MD  lisinopril (PRINIVIL,ZESTRIL) 40 MG tablet Take 40 mg by mouth daily.    Historical Provider, MD  Multiple Vitamin (MULTIVITAMIN) tablet Take 1 tablet by mouth daily.    Historical Provider, MD    Allergies Shellfish allergy  History reviewed. No pertinent family history.  Social History Social History  Substance Use Topics  . Smoking status: Never Smoker  . Smokeless tobacco: Never Used  . Alcohol use No    Review of Systems Constitutional: No fever/chills Eyes: No visual  changes. ENT: No sore throat. Cardiovascular: Denies chest pain. Respiratory: Denies shortness of breath. Gastrointestinal: No abdominal pain.  No nausea, no vomiting.  No diarrhea.  No constipation. Genitourinary: Negative for dysuria. Musculoskeletal: Negative for back pain. Skin: as above Neurological: Negative for headaches, focal weakness or numbness.  10-point ROS otherwise negative.  ____________________________________________   PHYSICAL EXAM:  VITAL SIGNS: ED Triage Vitals [04/29/16 1930]  Enc Vitals Group     BP 123/72     Pulse Rate (!) 144     Resp 16     Temp 98.8 F (37.1 C)     Temp Source Oral     SpO2 100 %     Weight 286 lb (129.7 kg)     Height 5\' 6"  (1.676 m)     Head Circumference      Peak Flow      Pain Score      Pain Loc      Pain Edu?      Excl. in GC?     Constitutional: Alert and oriented. Well appearing and in no acute distress. Eyes: Conjunctivae are normal. PERRL. EOMI. Head: Atraumatic. Nose: No congestion/rhinnorhea. Mouth/Throat: Mucous membranes are moist.   Neck: No stridor.   Cardiovascular: tachycardia, regular rhythm. Grossly normal heart sounds.   Respiratory: Normal respiratory effort.  No retractions. Lungs CTAB. Gastrointestinal: Soft and nontender. No distention.  Musculoskeletal: Lateral lower extremity warmth with erythema and edema. Erythema is extending up to just distal to the knees bilaterally. No tenderness to palpation. Bilateral and equal dorsalis  pedis pulses. Neurologic:  Normal speech and language. No gross focal neurologic deficits are appreciated.  Skin:  Skin is warm, dry and intact. No rash noted. Psychiatric: Mood and affect are normal. Speech and behavior are normal.  ____________________________________________   LABS (all labs ordered are listed, but only abnormal results are displayed)  Labs Reviewed  LACTIC ACID, PLASMA - Abnormal; Notable for the following:       Result Value   Lactic Acid,  Venous 2.0 (*)    All other components within normal limits  CBC WITH DIFFERENTIAL/PLATELET - Abnormal; Notable for the following:    WBC 12.5 (*)    RBC 3.75 (*)    Hemoglobin 9.9 (*)    HCT 30.1 (*)    RDW 15.2 (*)    Platelets 607 (*)    Neutro Abs 8.5 (*)    Monocytes Absolute 1.2 (*)    Eosinophils Absolute 1.5 (*)    All other components within normal limits  URINALYSIS, COMPLETE (UACMP) WITH MICROSCOPIC - Abnormal; Notable for the following:    Color, Urine YELLOW (*)    APPearance CLEAR (*)    Ketones, ur 5 (*)    All other components within normal limits  COMPREHENSIVE METABOLIC PANEL - Abnormal; Notable for the following:    Sodium 133 (*)    Potassium 3.4 (*)    Chloride 100 (*)    Glucose, Bld 159 (*)    Calcium 8.2 (*)    Albumin 3.0 (*)    AST 58 (*)    Total Bilirubin 0.2 (*)    All other components within normal limits  CULTURE, BLOOD (ROUTINE X 2)  CULTURE, BLOOD (ROUTINE X 2)  LACTIC ACID, PLASMA   ____________________________________________  EKG  ED ECG REPORT I, Deseray Daponte,  Teena Irani, the attending physician, personally viewed and interpreted this ECG.   Date: 04/29/2016  EKG Time: 1933  Rate: 141  Rhythm: sinus tachycardia  Axis: normal axis  Intervals:none  ST&T Change: No ST elevation or depression. No abnormal T-wave inversion.  ____________________________________________  RADIOLOGY  Ultrasound reviewed from January and no evidence of DVT at that time. ____________________________________________   PROCEDURES  Procedure(s) performed:   Procedures  Critical Care performed:   ____________________________________________   INITIAL IMPRESSION / ASSESSMENT AND PLAN / ED COURSE  Pertinent labs & imaging results that were available during my care of the patient were reviewed by me and considered in my medical decision making (see chart for details).  ----------------------------------------- 10:28 PM on  04/29/2016 -----------------------------------------  Patient still tachycardic to the 120s. Also with elevated white blood cell count and lactic acid of 2. Will be admitted to the hospital. Signed out the hospitalist, Dr. Emmit Pomfret.  Given the diagnoses will plan to the patient as well as his caretaker who is at the bedside. They're understanding the plan for admission and wanted to comply.      ____________________________________________   FINAL CLINICAL IMPRESSION(S) / ED DIAGNOSES  Sepsis. Cellulitis.    NEW MEDICATIONS STARTED DURING THIS VISIT:  New Prescriptions   No medications on file     Note:  This document was prepared using Dragon voice recognition software and may include unintentional dictation errors.    Myrna Blazer, MD 04/29/16 2228

## 2016-04-30 LAB — BASIC METABOLIC PANEL
ANION GAP: 7 (ref 5–15)
BUN: 15 mg/dL (ref 6–20)
CHLORIDE: 101 mmol/L (ref 101–111)
CO2: 25 mmol/L (ref 22–32)
Calcium: 7.8 mg/dL — ABNORMAL LOW (ref 8.9–10.3)
Creatinine, Ser: 1.12 mg/dL (ref 0.61–1.24)
GFR calc Af Amer: >60 mL/min (ref >60)
GLUCOSE: 91 mg/dL (ref 65–99)
POTASSIUM: 3.4 mmol/L — AB (ref 3.5–5.1)
SODIUM: 133 mmol/L — AB (ref 135–145)

## 2016-04-30 LAB — PHOSPHORUS: Phosphorus: 3.3 mg/dL (ref 2.5–4.6)

## 2016-04-30 LAB — CBC WITH DIFFERENTIAL/PLATELET
BASOS ABS: 0.1 10*3/uL (ref 0–0.1)
Basophils Relative: 1 %
Eosinophils Absolute: 1.6 10*3/uL — ABNORMAL HIGH (ref 0–0.7)
Eosinophils Relative: 14 %
HEMATOCRIT: 28.3 % — AB (ref 40.0–52.0)
Hemoglobin: 9.6 g/dL — ABNORMAL LOW (ref 13.0–18.0)
LYMPHS ABS: 1.5 10*3/uL (ref 1.0–3.6)
LYMPHS PCT: 13 %
MCH: 27 pg (ref 26.0–34.0)
MCHC: 34.1 g/dL (ref 32.0–36.0)
MCV: 79.3 fL — AB (ref 80.0–100.0)
Monocytes Absolute: 1.4 10*3/uL — ABNORMAL HIGH (ref 0.2–1.0)
Monocytes Relative: 13 %
NEUTROS ABS: 6.6 10*3/uL — AB (ref 1.4–6.5)
Neutrophils Relative %: 59 %
Platelets: 584 10*3/uL — ABNORMAL HIGH (ref 150–440)
RBC: 3.57 MIL/uL — AB (ref 4.40–5.90)
RDW: 15.2 % — ABNORMAL HIGH (ref 11.5–14.5)
WBC: 11.2 10*3/uL — AB (ref 3.8–10.6)

## 2016-04-30 LAB — TRANSFERRIN: TRANSFERRIN: 196 mg/dL (ref 180–329)

## 2016-04-30 LAB — FERRITIN: FERRITIN: 146 ng/mL (ref 24–336)

## 2016-04-30 LAB — IRON AND TIBC
IRON: 20 ug/dL — AB (ref 45–182)
Saturation Ratios: 7 % — ABNORMAL LOW (ref 17.9–39.5)
TIBC: 271 ug/dL (ref 250–450)
UIBC: 251 ug/dL

## 2016-04-30 LAB — MAGNESIUM: Magnesium: 1.8 mg/dL (ref 1.7–2.4)

## 2016-04-30 LAB — LACTIC ACID, PLASMA: Lactic Acid, Venous: 1 mmol/L (ref 0.5–1.9)

## 2016-04-30 MED ORDER — POTASSIUM CHLORIDE CRYS ER 20 MEQ PO TBCR
40.0000 meq | EXTENDED_RELEASE_TABLET | Freq: Once | ORAL | Status: AC
  Administered 2016-04-30: 40 meq via ORAL
  Filled 2016-04-30: qty 2

## 2016-04-30 MED ORDER — CEFAZOLIN SODIUM-DEXTROSE 2-4 GM/100ML-% IV SOLN
2.0000 g | Freq: Three times a day (TID) | INTRAVENOUS | Status: DC
  Filled 2016-04-30 (×2): qty 100

## 2016-04-30 MED ORDER — FERROUS SULFATE 325 (65 FE) MG PO TABS
325.0000 mg | ORAL_TABLET | Freq: Two times a day (BID) | ORAL | Status: DC
  Administered 2016-04-30 – 2016-05-01 (×2): 325 mg via ORAL
  Filled 2016-04-30 (×2): qty 1

## 2016-04-30 MED ORDER — CEFAZOLIN SODIUM-DEXTROSE 2-3 GM-% IV SOLR
2.0000 g | Freq: Three times a day (TID) | INTRAVENOUS | Status: DC
  Administered 2016-04-30 – 2016-05-01 (×3): 2 g via INTRAVENOUS
  Filled 2016-04-30 (×6): qty 50

## 2016-04-30 NOTE — Progress Notes (Signed)
Lovenox changed to 40 mg BID for BMI >40 and CrCl >30. 

## 2016-04-30 NOTE — Evaluation (Signed)
Physical Therapy Evaluation Patient Details Name: Joel Norton MRN: [id number] DOB: 04/02/1960 Today's Date: 04/30/2016   History of Present Illness  Joel Norton is a 56 y.o. male with a known history of HTN, MR who has a recent PMH associated with bilateral lower extremity cellulitis. He was admitted to this hospital in January and placed on IV antibiotics for the same symptoms. He was discharged home and has been on Keflex and Bactrim (of which he only took 1 dose) since then for nonhealing lower extremity wounds. He comes in today with worsening symptoms of redness, swelling but the patient himself denies any pain. Patient denies fevers/chills, weakness, dizziness, chest pain, shortness of breath, N/V/C/D, abdominal pain, dysuria/frequency, changes in mental status. Pt currently admitted for sepsis secondary to bilateral LE cellulitis, acute anemia, and hyponatremia/hypokalemia.   Clinical Impression  Pt admitted with above diagnosis. During PT evaluation pt is independent with bed mobility, transfers, and ambulation without an assistive device. His gait speed is slightly decreased but aunt reports that he is currently at his baseline level of functioning. HR increases to 138 during ambulation and RN notified. No signs of DOE and SaO2 remains at or above 95% throughout ambulation distance. No PT needs identified at this time. Current order will be completed. Please enter new order if status or needs change.     Follow Up Recommendations No PT follow up    Equipment Recommendations  None recommended by PT    Recommendations for Other Services       Precautions / Restrictions Precautions Precautions: Fall Restrictions Weight Bearing Restrictions: No      Mobility  Bed Mobility Overal bed mobility: Independent             General bed mobility comments: HOB flat and no bed rails  Transfers Overall transfer level: Independent Equipment used: None             General transfer  comment: Good stability noted with tranfers and once upright. Safe hand placement  Ambulation/Gait Ambulation/Gait assistance: Independent Ambulation Distance (Feet): 200 Feet Assistive device: None Gait Pattern/deviations: Decreased step length - right;Decreased step length - left Gait velocity: Decreased but functional for limited community mobility Gait velocity interpretation: <1.8 ft/sec, indicative of risk for recurrent falls General Gait Details: Pt able to ambulate a full lap around RN station. HR increase to 138 during ambulation but Sao2 remains >95% throughout. No DOE reported and no signs of respiratory distress. Pt denies pain and no grimacing or groaning with ambulation. Pt able to perform horizontal and vertical head turns without lateral gait deviation. Aunt reports that patient's mobility is currently at his baseline  Information systems managertairs            Wheelchair Mobility    Modified Rankin (Stroke Patients Only)       Balance                                             Pertinent Vitals/Pain Pain Assessment: No/denies pain    Home Living Family/patient expects to be discharged to:: Private residence Living Arrangements: Other relatives;Other (Comment) (Aunt) Available Help at Discharge: Family Type of Home: House Home Access: Stairs to enter Entrance Stairs-Rails: None Entrance Stairs-Number of Steps: 3 Home Layout: One level Home Equipment: None      Prior Function Level of Independence: Needs assistance   Gait / Transfers Assistance  Needed: Indepedent for ambulation without assistive device. No falls in the last 12 months  ADL's / Homemaking Assistance Needed: Assist from family for IADLs        Hand Dominance   Dominant Hand: Right    Extremity/Trunk Assessment   Upper Extremity Assessment Upper Extremity Assessment: Overall WFL for tasks assessed    Lower Extremity Assessment Lower Extremity Assessment: Overall WFL for tasks  assessed       Communication   Communication: No difficulties  Cognition Arousal/Alertness: Awake/alert Behavior During Therapy: WFL for tasks assessed/performed Overall Cognitive Status: History of cognitive impairments - at baseline                 General Comments: History of mental retardation    General Comments      Exercises     Assessment/Plan    PT Assessment Patent does not need any further PT services  PT Problem List            PT Treatment Interventions      PT Goals (Current goals can be found in the Care Plan section)  Acute Rehab PT Goals PT Goal Formulation: All assessment and education complete, DC therapy    Frequency     Barriers to discharge        Co-evaluation               End of Session Equipment Utilized During Treatment: Gait belt Activity Tolerance: Patient tolerated treatment well Patient left: in bed;with call bell/phone within reach;with family/visitor present;Other (comment) (Sitting at EOB with CNA) Nurse Communication: Mobility status;Other (comment) (HR during ambulation)         Time: 1130-1148 PT Time Calculation (min) (ACUTE ONLY): 18 min   Charges:   PT Evaluation $PT Eval Low Complexity: 1 Procedure     PT G Codes:       Joel Norton PT, DPT   Joel Norton 04/30/2016, 1:01 PM

## 2016-04-30 NOTE — Progress Notes (Addendum)
[facility] [place] [facility]   PATIENT NAME: Joel Norton    MR#:  [id number]  DATE OF BIRTH:  02/14/1961  SUBJECTIVE:  CHIEF COMPLAINT:   Chief Complaint  Patient presents with  . Cellulitis   Better bilateral leg cellulitis REVIEW OF SYSTEMS:  Review of Systems  Constitutional: Negative for chills, fever and malaise/fatigue.  HENT: Negative for congestion.   Eyes: Negative for blurred vision and double vision.  Respiratory: Negative for cough, shortness of breath and stridor.   Cardiovascular: Negative for chest pain and leg swelling.  Gastrointestinal: Negative for abdominal pain, blood in stool, diarrhea, melena, nausea and vomiting.  Genitourinary: Negative for dysuria and hematuria.  Musculoskeletal: Negative for back pain and joint pain.  Skin: Negative for itching and rash.  Neurological: Negative for dizziness, focal weakness, loss of consciousness and weakness.  Psychiatric/Behavioral: Negative for depression. The patient is not nervous/anxious.     DRUG ALLERGIES:   Allergies  Allergen Reactions  . Shellfish Allergy    VITALS:  Blood pressure 128/63, pulse (!) 109, temperature 98 F (36.7 C), temperature source Oral, resp. rate 18, height 5\' 6"  (1.676 m), weight 258 lb 8 oz (117.3 kg), SpO2 100 %. PHYSICAL EXAMINATION:  Physical Exam  Constitutional: He is oriented to person, place, and time.  Obese  HENT:  Head: Normocephalic.  Mouth/Throat: Oropharynx is clear and moist.  Eyes: Conjunctivae and EOM are normal.  Neck: Normal range of motion. Neck supple. No JVD present. No thyromegaly present.  Cardiovascular: Normal rate, regular rhythm and normal heart sounds.  Exam reveals no gallop.   No murmur heard. Pulmonary/Chest: Effort normal and breath sounds normal. No respiratory distress. He has no wheezes. He has no rales.  Abdominal: Soft. Bowel sounds are normal. He exhibits no distension. There is no tenderness.  Musculoskeletal:  Normal range of motion. He exhibits no edema or tenderness.  bilaterally leg mild nonpitting edema, diffuse scaling and patches of linear excoriation   Neurological: He is alert and oriented to person, place, and time. No cranial nerve deficit.  Skin: No erythema.  Psychiatric: Affect normal.   LABORATORY PANEL:   CBC  Recent Labs Lab 04/30/16 0028  WBC 11.2*  HGB 9.6*  HCT 28.3*  PLT 584*   ------------------------------------------------------------------------------------------------------------------ Chemistries   Recent Labs Lab 04/29/16 2126 04/30/16 0028  NA 133* 133*  K 3.4* 3.4*  CL 100* 101  CO2 26 25  GLUCOSE 159* 91  BUN 16 15  CREATININE 1.11 1.12  CALCIUM 8.2* 7.8*  MG  --  1.8  AST 58*  --   ALT 36  --   ALKPHOS 117  --   BILITOT 0.2*  --    RADIOLOGY:  No results found. ASSESSMENT AND PLAN:   This is a 56 y.o. male with a history of HTN, MR now being admitted with:  1. Sepsis secondary to cellulitis, failed outpatient therapy He was treated with vancomycin and Zosyn in ED, now on Ancef IV - Follow up blood cultures and ID consult.  2. Anemia, acute, microcytic and iron deficiency Hemoglobin is stable. Start [employer]osol.  3. Hyponatremia, mild Treated with IV fluid hydration and recheck BMP in a.m.  4. Hypokalemia, mild -By mouth replacement and recheck BMP in a.m.  5. History of hypertension -Continue Norvasc, Hydrocort thiazide, lisinopril   All the records are reviewed and case discussed with Care Management/Social Worker. Management plans discussed with the patient, his caregiver and they are in agreement.  CODE STATUS: Full code  TOTAL TIME TAKING CARE OF THIS PATIENT: 35 minutes.   More than 50% of the time was spent in counseling/coordination of care: YES  POSSIBLE D/C IN 2 DAYS, DEPENDING ON CLINICAL CONDITION.   Shaune Pollack M.D on 04/30/2016 at 3:53 PM  Between 7am to 6pm - Pager - [phone]  After 6pm go to  [email or web address] - Social research officer, government  [facility]  Office  [phone]  CC: Primary care physician; Lanier Ensign KEY, MD  Note: This dictation was prepared with Dragon dictation along with smaller phrase technology. Any transcriptional errors that result from this process are unintentional.

## 2016-04-30 NOTE — Consult Note (Signed)
[facility] Infectious Disease     Reason for Consult: LE cellulitis   Referring Physician: Hugeleyer, A Date of Admission:  04/29/2016   Active Problems:   Sepsis due to cellulitis [facility])   HPI: Joel Norton is a 56 y.o. male with mental retardation admitted with bil LE redness and swelling. He has been treated as otpt for LE cellulitis over last 2 weeks with admission in mid Jan and treatment with IV abx followed by oral keflex. On admit wbc 12, no fevers. Started vanco and zosyn.  Bil Dopplers negative.  Currently most info is obtained from his niece who is his primary care provider.   Past Medical History:  Diagnosis Date  . Hypertension   . Mental retardation    History reviewed. No pertinent surgical history. Social History  Substance Use Topics  . Smoking status: Never Smoker  . Smokeless tobacco: Never Used  . Alcohol use No   History reviewed. No pertinent family history.  Allergies:  Allergies  Allergen Reactions  . Shellfish Allergy     Current antibiotics: Antibiotics Given (last 72 hours)    Date/Time Action Medication Dose Rate   04/30/16 0541 Given   piperacillin-tazobactam (ZOSYN) IVPB 4.5 g 4.5 g 25 mL/hr   04/30/16 0543 Given   vancomycin (VANCOCIN) 1,500 mg in sodium chloride 0.9 % 500 mL IVPB 1,500 mg 250 mL/hr   04/30/16 1331 Given   piperacillin-tazobactam (ZOSYN) IVPB 4.5 g 4.5 g 25 mL/hr      MEDICATIONS: . amLODipine  10 mg Oral Daily  . enoxaparin (LOVENOX) injection  40 mg Subcutaneous BID  . hydrochlorothiazide  25 mg Oral Daily  . lisinopril  40 mg Oral Daily  . multivitamin with minerals  1 tablet Oral Daily  . piperacillin-tazobactam (ZOSYN)  IV  4.5 g Intravenous Q8H  . vancomycin  1,500 mg Intravenous Q12H    Review of Systems - unable to obtain   OBJECTIVE: Temp:  [98 F (36.7 C)-98.8 F (37.1 C)] 98 F (36.7 C) (02/07 1055) Pulse Rate:  [105-144] 105 (02/07 1055) Resp:  [16-28] 18 (02/07 1055) BP: (97-133)/(61-74)  133/66 (02/07 1055) SpO2:  [96 %-100 %] 100 % (02/07 1055) Weight:  [117.3 kg (258 lb 8 oz)-129.7 kg (286 lb)] 117.3 kg (258 lb 8 oz) (02/07 0010) Physical Exam  Constitutional: mentally retarded.  HENT: anicteric Mouth/Throat: Oropharynx is clear and dry. No oropharyngeal exudate.  Cardiovascular: Normal rate, regular rhythm and normal heart sounds.  Pulmonary/Chest: Effort normal and breath sounds normal. No respiratory distress. He has no wheezes.  Abdominal: Soft. Bowel sounds are normal. He exhibits no distension. There is no tenderness.  Lymphadenopathy: He has no cervical adenopathy.  Neurological: He is alert and oriented to person, place, and time.  Skin: bil LE with chronic scaly skin, mod erythema, some thin ss drainage. No purulence. Mild ttp  Psychiatric: quiet affect    LABS: Results for orders placed or performed during the hospital encounter of 04/29/16 (from the past 48 hour(s))  Lactic acid, plasma     Status: Abnormal   Collection Time: 04/29/16  8:19 PM  Result Value Ref Range   Lactic Acid, Venous 2.0 (HH) 0.5 - 1.9 mmol/L    Comment: CRITICAL RESULT CALLED TO, READ BACK BY AND VERIFIED WITH CALLED ANDREA BRYANT AT 2115 ON 04/29/16 BY SNJ   CBC with Differential     Status: Abnormal   Collection Time: 04/29/16  8:19 PM  Result Value Ref Range   WBC  12.5 (H) 3.8 - 10.6 K/uL   RBC 3.75 (L) 4.40 - 5.90 MIL/uL   Hemoglobin 9.9 (L) 13.0 - 18.0 g/dL   HCT 30.1 (L) 40.0 - 52.0 %   MCV 80.5 80.0 - 100.0 fL   MCH 26.4 26.0 - 34.0 pg   MCHC 32.8 32.0 - 36.0 g/dL   RDW 15.2 (H) 11.5 - 14.5 %   Platelets 607 (H) 150 - 440 K/uL   Neutrophils Relative % 67 %   Neutro Abs 8.5 (H) 1.4 - 6.5 K/uL   Lymphocytes Relative 10 %   Lymphs Abs 1.2 1.0 - 3.6 K/uL   Monocytes Relative 10 %   Monocytes Absolute 1.2 (H) 0.2 - 1.0 K/uL   Eosinophils Relative 12 %   Eosinophils Absolute 1.5 (H) 0 - 0.7 K/uL   Basophils Relative 1 %   Basophils Absolute 0.1 0 - 0.1 K/uL   Urinalysis, Complete w Microscopic     Status: Abnormal   Collection Time: 04/29/16  8:19 PM  Result Value Ref Range   Color, Urine YELLOW (A) YELLOW   APPearance CLEAR (A) CLEAR   Specific Gravity, Urine 1.013 1.005 - 1.030   pH 7.0 5.0 - 8.0   Glucose, UA NEGATIVE NEGATIVE mg/dL   Hgb urine dipstick NEGATIVE NEGATIVE   Bilirubin Urine NEGATIVE NEGATIVE   Ketones, ur 5 (A) NEGATIVE mg/dL   Protein, ur NEGATIVE NEGATIVE mg/dL   Nitrite NEGATIVE NEGATIVE   Leukocytes, UA NEGATIVE NEGATIVE   RBC / HPF NONE SEEN 0 - 5 RBC/hpf   WBC, UA 0-5 0 - 5 WBC/hpf   Bacteria, UA NONE SEEN NONE SEEN   Squamous Epithelial / LPF NONE SEEN NONE SEEN   Mucous PRESENT   Blood Culture (routine x 2)     Status: None (Preliminary result)   Collection Time: 04/29/16  8:19 PM  Result Value Ref Range   Specimen Description BLOOD LEFT AC    Special Requests BOTTLES DRAWN AEROBIC AND ANAEROBIC BCLV    Culture NO GROWTH < 12 HOURS    Report Status PENDING   Blood Culture (routine x 2)     Status: None (Preliminary result)   Collection Time: 04/29/16  8:19 PM  Result Value Ref Range   Specimen Description BLOOD  L AC    Special Requests BOTTLES DRAWN AEROBIC AND ANAEROBIC BCAV    Culture NO GROWTH < 12 HOURS    Report Status PENDING   Comprehensive metabolic panel     Status: Abnormal   Collection Time: 04/29/16  9:26 PM  Result Value Ref Range   Sodium 133 (L) 135 - 145 mmol/L   Potassium 3.4 (L) 3.5 - 5.1 mmol/L   Chloride 100 (L) 101 - 111 mmol/L   CO2 26 22 - 32 mmol/L   Glucose, Bld 159 (H) 65 - 99 mg/dL   BUN 16 6 - 20 mg/dL   Creatinine, Ser 1.11 0.61 - 1.24 mg/dL   Calcium 8.2 (L) 8.9 - 10.3 mg/dL   Total Protein 7.1 6.5 - 8.1 g/dL   Albumin 3.0 (L) 3.5 - 5.0 g/dL   AST 58 (H) 15 - 41 U/L   ALT 36 17 - 63 U/L   Alkaline Phosphatase 117 38 - 126 U/L   Total Bilirubin 0.2 (L) 0.3 - 1.2 mg/dL   GFR calc non Af Amer >60 >60 mL/min   GFR calc Af Amer >60 >60 mL/min    Comment: (NOTE) The  eGFR has been calculated using the  CKD EPI equation. This calculation has not been validated in all clinical situations. eGFR's persistently <60 mL/min signify possible Chronic Kidney Disease.    Anion gap 7 5 - 15  Lactic acid, plasma     Status: None   Collection Time: 04/30/16 12:28 AM  Result Value Ref Range   Lactic Acid, Venous 1.0 0.5 - 1.9 mmol/L  Basic metabolic panel     Status: Abnormal   Collection Time: 04/30/16 12:28 AM  Result Value Ref Range   Sodium 133 (L) 135 - 145 mmol/L   Potassium 3.4 (L) 3.5 - 5.1 mmol/L   Chloride 101 101 - 111 mmol/L   CO2 25 22 - 32 mmol/L   Glucose, Bld 91 65 - 99 mg/dL   BUN 15 6 - 20 mg/dL   Creatinine, Ser 1.12 0.61 - 1.24 mg/dL   Calcium 7.8 (L) 8.9 - 10.3 mg/dL   GFR calc non Af Amer >60 >60 mL/min   GFR calc Af Amer >60 >60 mL/min    Comment: (NOTE) The eGFR has been calculated using the CKD EPI equation. This calculation has not been validated in all clinical situations. eGFR's persistently <60 mL/min signify possible Chronic Kidney Disease.    Anion gap 7 5 - 15  CBC WITH DIFFERENTIAL     Status: Abnormal   Collection Time: 04/30/16 12:28 AM  Result Value Ref Range   WBC 11.2 (H) 3.8 - 10.6 K/uL   RBC 3.57 (L) 4.40 - 5.90 MIL/uL   Hemoglobin 9.6 (L) 13.0 - 18.0 g/dL   HCT 28.3 (L) 40.0 - 52.0 %   MCV 79.3 (L) 80.0 - 100.0 fL   MCH 27.0 26.0 - 34.0 pg   MCHC 34.1 32.0 - 36.0 g/dL   RDW 15.2 (H) 11.5 - 14.5 %   Platelets 584 (H) 150 - 440 K/uL   Neutrophils Relative % 59 %   Neutro Abs 6.6 (H) 1.4 - 6.5 K/uL   Lymphocytes Relative 13 %   Lymphs Abs 1.5 1.0 - 3.6 K/uL   Monocytes Relative 13 %   Monocytes Absolute 1.4 (H) 0.2 - 1.0 K/uL   Eosinophils Relative 14 %   Eosinophils Absolute 1.6 (H) 0 - 0.7 K/uL   Basophils Relative 1 %   Basophils Absolute 0.1 0 - 0.1 K/uL  Magnesium     Status: None   Collection Time: 04/30/16 12:28 AM  Result Value Ref Range   Magnesium 1.8 1.7 - 2.4 mg/dL  Phosphorus     Status:  None   Collection Time: 04/30/16 12:28 AM  Result Value Ref Range   Phosphorus 3.3 2.5 - 4.6 mg/dL  Ferritin     Status: None   Collection Time: 04/30/16 12:28 AM  Result Value Ref Range   Ferritin 146 24 - 336 ng/mL  Transferrin     Status: None   Collection Time: 04/30/16 12:28 AM  Result Value Ref Range   Transferrin 196 180 - 329 mg/dL    Comment: Performed at [facility], [address], [place], [place] [address]  Iron and TIBC     Status: Abnormal   Collection Time: 04/30/16 12:28 AM  Result Value Ref Range   Iron 20 (L) 45 - 182 ug/dL   TIBC 271 250 - 450 ug/dL   Saturation Ratios 7 (L) 17.9 - 39.5 %   UIBC 251 ug/dL   No components found for: ESR, C REACTIVE PROTEIN MICRO: Recent Results (from the past 720 hour(s))  Blood  Culture (routine x 2)     Status: None   Collection Time: 04/06/16  4:48 PM  Result Value Ref Range Status   Specimen Description BLOOD  R AC  Final   Special Requests BOTTLES DRAWN AEROBIC AND ANAEROBIC  ANA 4 AER 7  Final   Culture NO GROWTH 5 DAYS  Final   Report Status 04/11/2016 FINAL  Final  Blood Culture (routine x 2)     Status: None   Collection Time: 04/06/16  5:48 PM  Result Value Ref Range Status   Specimen Description BLOOD LEFT ASSIST CONTROL  Final   Special Requests   Final    BOTTLES DRAWN AEROBIC AND ANAEROBIC ANA10ML AER10ML   Culture NO GROWTH 5 DAYS  Final   Report Status 04/11/2016 FINAL  Final  Urine culture     Status: None   Collection Time: 04/06/16  7:10 PM  Result Value Ref Range Status   Specimen Description URINE, RANDOM  Final   Special Requests NONE  Final   Culture NO GROWTH Performed at [facility]   Final   Report Status 04/08/2016 FINAL  Final  Blood Culture (routine x 2)     Status: None (Preliminary result)   Collection Time: 04/29/16  8:19 PM  Result Value Ref Range Status   Specimen Description BLOOD LEFT AC  Final   Special Requests BOTTLES DRAWN AEROBIC AND ANAEROBIC BCLV  Final    Culture NO GROWTH < 12 HOURS  Final   Report Status PENDING  Incomplete  Blood Culture (routine x 2)     Status: None (Preliminary result)   Collection Time: 04/29/16  8:19 PM  Result Value Ref Range Status   Specimen Description BLOOD  L AC  Final   Special Requests BOTTLES DRAWN AEROBIC AND ANAEROBIC BCAV  Final   Culture NO GROWTH < 12 HOURS  Final   Report Status PENDING  Incomplete    IMAGING: US Venous Img Lower Bilateral  Result Date: 04/06/2016 CLINICAL DATA:  Redness and swelling in BILATERAL lower extremities for 1 day EXAM: BILATERAL LOWER EXTREMITY VENOUS DOPPLER ULTRASOUND TECHNIQUE: Gray-scale sonography with graded compression, as well as color Doppler and duplex ultrasound were performed to evaluate the lower extremity deep venous systems from the level of the common femoral vein and including the common femoral, femoral, profunda femoral, popliteal and calf veins including the posterior tibial, peroneal and gastrocnemius veins when visible. The superficial great saphenous vein was also interrogated. Spectral Doppler was utilized to evaluate flow at rest and with distal augmentation maneuvers in the common femoral, femoral and popliteal veins. COMPARISON:  None FINDINGS: RIGHT LOWER EXTREMITY Common Femoral Vein: No evidence of thrombus. Normal compressibility, respiratory phasicity and response to augmentation. Saphenofemoral Junction: No evidence of thrombus. Normal compressibility and flow on color Doppler imaging. Profunda Femoral Vein: No evidence of thrombus. Normal compressibility and flow on color Doppler imaging. Femoral Vein: No evidence of thrombus. Normal compressibility, respiratory phasicity and response to augmentation. Popliteal Vein: No evidence of thrombus. Normal compressibility, respiratory phasicity and response to augmentation. Calf Veins: No evidence of thrombus. Normal compressibility and flow on color Doppler imaging. Superficial Great Saphenous Vein: No  evidence of thrombus. Normal compressibility and flow on color Doppler imaging. Venous Reflux:  None. Other Findings: RIGHT inguinal lymph nodes measuring 1.6 cm and 1.3 cm in short axis. LEFT LOWER EXTREMITY Common Femoral Vein: No evidence of thrombus. Normal compressibility, respiratory phasicity and response to augmentation. Saphenofemoral Junction: No evidence of thrombus.  Normal compressibility and flow on color Doppler imaging. Profunda Femoral Vein: No evidence of thrombus. Normal compressibility and flow on color Doppler imaging. Femoral Vein: No evidence of thrombus. Normal compressibility, respiratory phasicity and response to augmentation. Popliteal Vein: No evidence of thrombus. Normal compressibility, respiratory phasicity and response to augmentation. Calf Veins: Limited visualization of LEFT peroneal veins. Posterior tibial veins visualized. No evidence of thrombus. Normal compressibility and flow on color Doppler imaging. Superficial Great Saphenous Vein: No evidence of thrombus. Normal compressibility and flow on color Doppler imaging. Venous Reflux:  None. Other Findings: LEFT inguinal lymph nodes measuring 1.2 cm and 1.1 cm in short axis. IMPRESSION: No evidence of deep venous thrombosis in either lower extremity. Upper normal sized RIGHT inguinal lymph node. Electronically Signed   By: Lavonia Dana M.D.   On: 04/06/2016 17:56   Dg Chest Port 1 View  Result Date: 04/06/2016 CLINICAL DATA:  Bilateral lower limb swelling starting today. EXAM: PORTABLE CHEST 1 VIEW COMPARISON:  None. FINDINGS: 1655 hours. The lungs are clear wiithout focal pneumonia, edema, pneumothorax or pleural effusion. Cardiopericardial silhouette is at upper limits of normal for size. The visualized bony structures of the thorax are intact. Telemetry leads overlie the chest. IMPRESSION: No active disease. Electronically Signed   By: Misty Stanley M.D.   On: 04/06/2016 17:11    Assessment:   LASHAUN KRAPF is a 56 y.o.  male with chronic lymphedema and LE dermatitis with overlying cellulitis. Doppler neg, No fevers, wbc nml, no open wounds. Will need multifaceted approach to control edema, improve skin and treat infection   Recommendations Dc vanco and zosyn Change to ancef Elevate legs overnight Tomorrow can place bil Unnaboots and leave on until I see him next week Would dc on oral keflex 500 tid for 14 days (no need for the bactrim started as otpt) I can see next week  Thank you very much for allowing me to participate in the care of this patient. Please call with questions.   Cheral Marker. Ola Spurr, MD

## 2016-04-30 NOTE — Consult Note (Signed)
WOC Nurse wound consult note Reason for Consult:Bilateral lower extremity edema, chronic.  Cellulitis.  Wound type:Infectious.  Chronic venous insufficiency Pressure Injury POA: N/A Measurement:Scattered nonintact lesions.  Dry skin.  Chronic changes.  Wound bed:dry and scabbed linear abrasions Drainage (amount, consistency, odor) minimal serous weeping Periwound:Edema, dry skin Dressing procedure/placement/frequency:Moisturize legs with lotion daily.  Will monitor marks of cellulitis today.  If improved, will begin Unnas boots Thursday.  To be changed weekly.  Will need HH follow up after discharge.  WOC team will follow.  Maple HudsonKaren Kaamil Morefield RN BSN CWON Pager [phone]

## 2016-04-30 NOTE — Progress Notes (Signed)
New Admission Note:   Arrival Method: per stretcher from ED, pt lives with niece at home Mental Orientation: alert and oriented X3-4, can be somewhat off with time and dates Telemetry: placed on telebox [id number], CCMD notified, verified with RN French Anaracy Assessment: Completed Skin: warm, dry, with redness, swelling and cellulitis noted on bilateral lower extremities. Scratch marks and abrasion noted on both lower legs IV: G22 on the left AC with transparent dressing, intact Pain: denies any pain as of this time Safety Measures: Safety Fall Prevention Plan has been given and discussed Admission: Completed 1A Orientation: Patient has been oriented to the room, unit and staff.  Family: niece Jasmine DecemberSharon, primary caregiver at bedside  Orders have been reviewed and implemented. Will continue to monitor patient. Call light has been placed within reach and bed alarm has been activated.   Janice NorrieAnessa Lachandra Dettmann BSN, RN [facility] 1A

## 2016-05-01 LAB — BASIC METABOLIC PANEL
ANION GAP: 5 (ref 5–15)
BUN: 9 mg/dL (ref 6–20)
CALCIUM: 8.5 mg/dL — AB (ref 8.9–10.3)
CO2: 27 mmol/L (ref 22–32)
CREATININE: 1.12 mg/dL (ref 0.61–1.24)
Chloride: 102 mmol/L (ref 101–111)
Glucose, Bld: 100 mg/dL — ABNORMAL HIGH (ref 65–99)
Potassium: 4 mmol/L (ref 3.5–5.1)
SODIUM: 134 mmol/L — AB (ref 135–145)

## 2016-05-01 MED ORDER — CEPHALEXIN 500 MG PO CAPS
500.0000 mg | ORAL_CAPSULE | Freq: Three times a day (TID) | ORAL | Status: DC
  Administered 2016-05-01: 500 mg via ORAL
  Filled 2016-05-01: qty 1

## 2016-05-01 MED ORDER — CEPHALEXIN 500 MG PO CAPS: 500.0000 mg | ORAL_CAPSULE | Freq: Three times a day (TID) | ORAL | 0 refills | Status: DC

## 2016-05-01 MED ORDER — ONDANSETRON HCL 4 MG PO TABS: 4.0000 mg | ORAL_TABLET | Freq: Three times a day (TID) | ORAL | 0 refills | Status: DC | PRN

## 2016-05-01 NOTE — Discharge Instructions (Signed)
Heart healthy diet

## 2016-05-01 NOTE — Discharge Summary (Signed)
[facility] [place] [facility]   PATIENT NAME: Joel Norton    MR#:  [id number]  DATE OF BIRTH:  Aug 17, 1960  DATE OF ADMISSION:  04/29/2016   ADMITTING PHYSICIAN: Tonye Royalty, DO  DATE OF DISCHARGE: 05/01/2016 11:24 AM  PRIMARY CARE PHYSICIAN: SOLES, MEREDITH KEY, MD   ADMISSION DIAGNOSIS:  Sepsis, due to unspecified organism (HCC) [A41.9] Cellulitis, unspecified cellulitis site [L03.90] DISCHARGE DIAGNOSIS:  Active Problems:   Sepsis due to cellulitis (HCC)  SECONDARY DIAGNOSIS:   Past Medical History:  Diagnosis Date  . Hypertension   . Mental retardation    HOSPITAL COURSE:  This is a 56 y.o.malewith a history of HTN, MRnow being admitted with:  1. Sepsis secondary to bilateral leg cellulitis, failed outpatient therapy He was treated with vancomycin and Zosyn in ED, now on Ancef IV Negative blood cultures  so far. Unna boots and Keflex 500 mg 3 times a day for 14 days per Dr. Sampson Goon, ID consult.  2. Anemia, acute, microcytic and iron deficiency Hemoglobin is stable. Follow-up PCP.  3. Hyponatremia, mild Improvements normal saline IV.  4. Hypokalemia, mild Improved with potassium supplement.  5. History of hypertension -Continue Norvasc, Hydrocort thiazide, lisinopril  DISCHARGE CONDITIONS:  Stable, discharged to home today. CONSULTS OBTAINED:  Treatment Team:  Gaynelle Adu, MD Mick Sell, MD DRUG ALLERGIES:   Allergies  Allergen Reactions  . Shellfish Allergy    DISCHARGE MEDICATIONS:   Allergies as of 05/01/2016      Reactions   Shellfish Allergy       Medication List    TAKE these medications   amLODipine 10 MG tablet Commonly known as:  NORVASC Take 10 mg by mouth daily.   cephALEXin 500 MG capsule Commonly known as:  KEFLEX Take 1 capsule (500 mg total) by mouth 3 (three) times daily. What changed:  when to take this Notes to patient:  Take 3 times a day until completed     hydrochlorothiazide 25 MG tablet Commonly known as:  HYDRODIURIL Take 25 mg by mouth daily.   lisinopril 40 MG tablet Commonly known as:  PRINIVIL,ZESTRIL Take 40 mg by mouth daily.   multivitamin tablet Take 1 tablet by mouth daily.   ondansetron 4 MG tablet Commonly known as:  ZOFRAN Take 1 tablet (4 mg total) by mouth every 8 (eight) hours as needed for nausea or vomiting. Notes to patient:  Can take with antibiotic if causing nausea        DISCHARGE INSTRUCTIONS:  See AVS.  If you experience worsening of your admission symptoms, develop shortness of breath, life threatening emergency, suicidal or homicidal thoughts you must seek medical attention immediately by calling 911 or calling your MD immediately  if symptoms less severe.  You Must read complete instructions/literature along with all the possible adverse reactions/side effects for all the Medicines you take and that have been prescribed to you. Take any new Medicines after you have completely understood and accpet all the possible adverse reactions/side effects.   Please note  You were cared for by a hospitalist during your hospital stay. If you have any questions about your discharge medications or the care you received while you were in the hospital after you are discharged, you can call the unit and asked to speak with the hospitalist on call if the hospitalist that took care of you is not available. Once you are discharged, your primary care physician will handle any further medical issues. Please note that NO  REFILLS for any discharge medications will be authorized once you are discharged, as it is imperative that you return to your primary care physician (or establish a relationship with a primary care physician if you do not have one) for your aftercare needs so that they can reassess your need for medications and monitor your lab values.    On the day of Discharge:  VITAL SIGNS:  Blood pressure 117/60, pulse  (!) 119, temperature 97.6 F (36.4 C), temperature source Oral, resp. rate 20, height 5\' 6"  (1.676 m), weight 258 lb 8 oz (117.3 kg), SpO2 100 %. PHYSICAL EXAMINATION:  GENERAL:  56 y.o.-year-old patient lying in the bed with no acute distress.  EYES: Pupils equal, round, reactive to light and accommodation. No scleral icterus. Extraocular muscles intact.  HEENT: Head atraumatic, normocephalic. Oropharynx and nasopharynx clear.  NECK:  Supple, no jugular venous distention. No thyroid enlargement, no tenderness.  LUNGS: Normal breath sounds bilaterally, no wheezing, rales,rhonchi or crepitation. No use of accessory muscles of respiration.  CARDIOVASCULAR: S1, S2 normal. No murmurs, rubs, or gallops.  ABDOMEN: Soft, non-tender, non-distended. Bowel sounds present. No organomegaly or mass.  EXTREMITIES: No pedal edema, cyanosis, or clubbing. bilaterally leg mild nonpitting edema, diffuse scaling and patches of linear excoriation   NEUROLOGIC: Cranial nerves II through XII are intact. Muscle strength 5/5 in all extremities. Sensation intact. Gait not checked.  PSYCHIATRIC: The patient is alert and oriented x 3.  SKIN: No obvious rash, lesion, or ulcer.  DATA REVIEW:   CBC  Recent Labs Lab 04/30/16 0028  WBC 11.2*  HGB 9.6*  HCT 28.3*  PLT 584*    Chemistries   Recent Labs Lab 04/29/16 2126 04/30/16 0028 05/01/16 0602  NA 133* 133* 134*  K 3.4* 3.4* 4.0  CL 100* 101 102  CO2 26 25 27   GLUCOSE 159* 91 100*  BUN 16 15 9   CREATININE 1.11 1.12 1.12  CALCIUM 8.2* 7.8* 8.5*  MG  --  1.8  --   AST 58*  --   --   ALT 36  --   --   ALKPHOS 117  --   --   BILITOT 0.2*  --   --      Microbiology Results  Results for orders placed or performed during the hospital encounter of 04/29/16  Blood Culture (routine x 2)     Status: None (Preliminary result)   Collection Time: 04/29/16  8:19 PM  Result Value Ref Range Status   Specimen Description BLOOD LEFT AC  Final   Special  Requests BOTTLES DRAWN AEROBIC AND ANAEROBIC BCLV  Final   Culture NO GROWTH 2 DAYS  Final   Report Status PENDING  Incomplete  Blood Culture (routine x 2)     Status: None (Preliminary result)   Collection Time: 04/29/16  8:19 PM  Result Value Ref Range Status   Specimen Description BLOOD  L AC  Final   Special Requests BOTTLES DRAWN AEROBIC AND ANAEROBIC BCAV  Final   Culture NO GROWTH 2 DAYS  Final   Report Status PENDING  Incomplete    RADIOLOGY:  No results found.   Management plans discussed with the patient, His caregiver and they are in agreement.  CODE STATUS:     Code Status Orders        Start     Ordered   04/29/16 2353  Full code  Continuous     04/29/16 2352    Code Status History  Date Active Date Inactive Code Status Order ID Comments User Context   04/06/2016  9:14 PM 04/07/2016  1:56 PM Full Code [id number]  Shaune PollackQing Avry Monteleone, MD Inpatient      TOTAL TIME TAKING CARE OF THIS PATIENT: 31 minutes.    Shaune Pollackhen, Baldemar Dady M.D on 05/01/2016 at 2:26 PM  Between 7am to 6pm - Pager - [phone]  After 6pm go to [email or web address] - Social research officer, [email or web address] [email or web address]  [facility]  Office  [phone]  CC: Primary care physician; Lanier EnsignSOLES, MEREDITH KEY, MD   Note: This dictation was prepared with Dragon dictation along with smaller phrase technology. Any transcriptional errors that result from this process are unintentional.

## 2016-05-01 NOTE — Progress Notes (Signed)
Patient's vital signs stable. Patient and niece were given discharge instructions, hard scripts and copy of AVS. Niece verbalized understanding of all discharge instructions. IV discontinued. All belongings packed up tech escorted patient and niece out to the car.   Suzan SlickAlison L Devyon Keator, RN

## 2016-05-01 NOTE — Consult Note (Signed)
WOC Nurse wound follow up :Reason for Consult:Bilateral lower extremity edema, chronic.  Cellulitis, improving.  Discharging today.  Will follow up with Dr Sampson GoonFitzgerald in one week.  Wound type:Infectious.  Chronic venous insufficiency.   Pressure Injury POA: N/A Measurement:Scattered nonintact lesions.  Dry skin.  Chronic changes.  Wound bed:dry and scabbed linear abrasions Drainage (amount, consistency, odor) minimal serous weeping Periwound:Edema, dry skin Dressing procedure/placement/frequency:LEgs cleansed with soap and water.  Zinc layer from below toes to below knee.  Secure with self adherent Coban.  Follow up with Dr Sampson GoonFitzgerald next week to discuss removable compression hose long term.  Will not follow at this time.  Please re-consult if needed.  Maple HudsonKaren Veeda Virgo RN BSN CWON Pager [phone]

## 2016-05-04 LAB — CULTURE, BLOOD (ROUTINE X 2)
Culture: NO GROWTH
Culture: NO GROWTH

## 2016-05-07 DIAGNOSIS — L03119 Cellulitis of unspecified part of limb: Secondary | ICD-10-CM | POA: Insufficient documentation

## 2016-05-23 DIAGNOSIS — I890 Lymphedema, not elsewhere classified: Secondary | ICD-10-CM | POA: Insufficient documentation

## 2016-07-11 DIAGNOSIS — R21 Rash and other nonspecific skin eruption: Secondary | ICD-10-CM | POA: Insufficient documentation

## 2017-06-25 ENCOUNTER — Other Ambulatory Visit: Payer: Self-pay

## 2017-06-25 ENCOUNTER — Telehealth: Payer: Self-pay

## 2017-06-25 DIAGNOSIS — Z1211 Encounter for screening for malignant neoplasm of colon: Secondary | ICD-10-CM

## 2017-06-25 NOTE — Telephone Encounter (Signed)
Gastroenterology Pre-Procedure Review  Request Date: 08/03/17 Requesting Physician: Dr. Servando SnareWohl  PATIENT REVIEW QUESTIONS: The patient responded to the following health history questions as indicated:    1. Are you having any GI issues? no 2. Do you have a personal history of Polyps? no 3. Do you have a family history of Colon Cancer or Polyps? no 4. Diabetes Mellitus? no 5. Joint replacements in the past 12 months?no 6. Major health problems in the past 3 months?no 7. Any artificial heart valves, MVP, or defibrillator?no    MEDICATIONS & ALLERGIES:    Patient reports the following regarding taking any anticoagulation/antiplatelet therapy:   Plavix, Coumadin, Eliquis, Xarelto, Lovenox, Pradaxa, Brilinta, or Effient? no Aspirin? no  Patient confirms/reports the following medications:  Current Outpatient Medications  Medication Sig Dispense Refill  . amLODipine (NORVASC) 10 MG tablet Take 10 mg by mouth daily.    . cephALEXin (KEFLEX) 500 MG capsule Take 1 capsule (500 mg total) by mouth 3 (three) times daily. 42 capsule 0  . hydrochlorothiazide (HYDRODIURIL) 25 MG tablet Take 25 mg by mouth daily.    Marland Kitchen. lisinopril (PRINIVIL,ZESTRIL) 40 MG tablet Take 40 mg by mouth daily.    . Multiple Vitamin (MULTIVITAMIN) tablet Take 1 tablet by mouth daily.    . ondansetron (ZOFRAN) 4 MG tablet Take 1 tablet (4 mg total) by mouth every 8 (eight) hours as needed for nausea or vomiting. 20 tablet 0   No current facility-administered medications for this visit.     Patient confirms/reports the following allergies:  Allergies  Allergen Reactions  . Shellfish Allergy     No orders of the defined types were placed in this encounter.   AUTHORIZATION INFORMATION Primary Insurance: 1D#: Group #:  Secondary Insurance: 1D#: Group #:  SCHEDULE INFORMATION: Date: 08/03/17 Time: Location:[facility]

## 2017-07-20 ENCOUNTER — Telehealth: Payer: Self-pay | Admitting: Gastroenterology

## 2017-07-20 NOTE — Telephone Encounter (Signed)
Please call Vela Prose at [phone] to reschedule patients procedure.

## 2017-07-20 NOTE — Telephone Encounter (Signed)
LVM for pt to return my call.

## 2017-07-20 NOTE — Telephone Encounter (Signed)
Joel Norton returned call and pt has been rescheduled to June 3rd.

## 2017-08-14 ENCOUNTER — Telehealth: Payer: Self-pay | Admitting: Gastroenterology

## 2017-08-14 NOTE — Telephone Encounter (Signed)
Pt niece left vm to reschedule pt colonoscopy for 06/3 she can be reached at  Work [phone] or cell [phone]

## 2017-08-14 NOTE — Telephone Encounter (Signed)
Patients niece has been contacted to reschedule colonoscopy.  Date has been changed from 08/24/17 to 09/28/17.  LVM for Joel Norton and Kim at [facility] on voice mail to notify of date change.  Thanks [employer]

## 2017-09-17 ENCOUNTER — Encounter: Payer: Self-pay | Admitting: *Deleted

## 2017-09-17 ENCOUNTER — Other Ambulatory Visit: Payer: Self-pay

## 2017-09-18 ENCOUNTER — Encounter: Payer: Self-pay | Admitting: Anesthesiology

## 2017-09-18 NOTE — Anesthesia Preprocedure Evaluation (Deleted)

## 2017-09-21 ENCOUNTER — Telehealth: Payer: Self-pay | Admitting: Gastroenterology

## 2017-09-21 NOTE — Telephone Encounter (Signed)
Patient niece Jasmine DecemberSharon called from cell #[phone] to cancel patients procedure on 7.8.19. Jasmine DecemberSharon states she will call back to reschedule at a later date.

## 2017-09-21 NOTE — Telephone Encounter (Signed)
Contact [facility] and cancelled procedure.

## 2017-09-28 ENCOUNTER — Ambulatory Visit: Admission: RE | Admit: 2017-09-28 | Payer: Medicaid Other | Source: Ambulatory Visit | Admitting: Gastroenterology

## 2017-09-28 SURGERY — COLONOSCOPY WITH PROPOFOL
Anesthesia: Choice

## 2019-01-07 ENCOUNTER — Encounter: Payer: Self-pay | Admitting: Family Medicine

## 2019-01-10 ENCOUNTER — Other Ambulatory Visit: Payer: Self-pay

## 2019-01-10 ENCOUNTER — Telehealth: Payer: Self-pay

## 2019-01-10 DIAGNOSIS — Z1211 Encounter for screening for malignant neoplasm of colon: Secondary | ICD-10-CM

## 2019-01-10 NOTE — Telephone Encounter (Signed)
Gastroenterology Pre-Procedure Review  Request Date: Friday 02/11/19 Requesting Physician: Dr. Allen Norris  PATIENT REVIEW QUESTIONS: The patient's guardian Ivin Booty responded to the following health history questions as indicated:    1. Are you having any GI issues? no 2. Do you have a personal history of Polyps? no 3. Do you have a family history of Colon Cancer or Polyps? no 4. Diabetes Mellitus? no 5. Joint replacements in the past 12 months?no 6. Major health problems in the past 3 months?no 7. Any artificial heart valves, MVP, or defibrillator?no    MEDICATIONS & ALLERGIES:    Patient reports the following regarding taking any anticoagulation/antiplatelet therapy:   Plavix, Coumadin, Eliquis, Xarelto, Lovenox, Pradaxa, Brilinta, or Effient? no Aspirin? no  Patient confirms/reports the following medications:  Current Outpatient Medications  Medication Sig Dispense Refill  . amLODipine (NORVASC) 10 MG tablet Take 10 mg by mouth daily.    . chlorthalidone (HYGROTON) 25 MG tablet Take 25 mg by mouth daily.    Marland Kitchen lisinopril (PRINIVIL,ZESTRIL) 40 MG tablet Take 40 mg by mouth daily.    . Multiple Vitamin (MULTIVITAMIN) tablet Take 1 tablet by mouth daily.    . Skin Protectants, Misc. (EUCERIN) cream Apply topically as needed for dry skin.     No current facility-administered medications for this visit.     Patient confirms/reports the following allergies:  Allergies  Allergen Reactions  . Shellfish Allergy     No orders of the defined types were placed in this encounter.   AUTHORIZATION INFORMATION Primary Insurance: 1D#: Group #:  Secondary Insurance: 1D#: Group #:  SCHEDULE INFORMATION: Date: Friday 02/11/19 Time: Location:[facility]

## 2019-02-03 ENCOUNTER — Other Ambulatory Visit: Payer: Self-pay

## 2019-02-03 ENCOUNTER — Encounter: Payer: Self-pay | Admitting: *Deleted

## 2019-02-04 ENCOUNTER — Encounter: Payer: Self-pay | Admitting: Anesthesiology

## 2019-02-07 ENCOUNTER — Telehealth: Payer: Self-pay

## 2019-02-07 NOTE — Progress Notes (Signed)
In conversation with patient caregiver, Jeannie Done, she said she was Blade Roy's guardian.  When told she would need to bring those documents to be able to sign his papers, she stated that she did not have any documentation.  She has just always been allowed to sign his papers.  She stated that he does not understand enough to be able to sign papers on his own.  I explained that we cannot accept her signature, if she is not legally his guardian.  Guardianship/POA would have to be addressed before any non-emergent procedure could be done.

## 2019-02-07 NOTE — Telephone Encounter (Signed)
Left vm for pts niece, Jeannie Done that I received a call from [facility] regarding colonoscopy that is currently scheduled for 02/11/19. Informed her on the voicemail that in order for pt to have his procedure, she will need to get documentation stating she is his POA. Pt is mental disabled and cannot sign his own consent.

## 2019-02-08 ENCOUNTER — Other Ambulatory Visit: Admission: RE | Admit: 2019-02-08 | Payer: Medicaid Other | Source: Ambulatory Visit

## 2019-02-11 ENCOUNTER — Ambulatory Visit: Admit: 2019-02-11 | Payer: Medicaid Other | Admitting: Gastroenterology

## 2019-02-11 HISTORY — DX: Thyrotoxicosis, unspecified without thyrotoxic crisis or storm: E05.90

## 2019-02-11 SURGERY — COLONOSCOPY WITH PROPOFOL
Anesthesia: Choice

## 2019-06-03 ENCOUNTER — Ambulatory Visit: Payer: Medicaid Other | Attending: Internal Medicine

## 2019-06-03 DIAGNOSIS — Z23 Encounter for immunization: Secondary | ICD-10-CM

## 2019-06-03 NOTE — Progress Notes (Signed)
   Covid-19 Vaccination Clinic  Name:  Joel Norton    MRN: [id number] DOB: 1960/04/02  06/03/2019  Mr. Fails was observed post Covid-19 immunization for 15 minutes without incident. He was provided with Vaccine Information Sheet and instruction to access the V-Safe system.   [name] was instructed to call 911 with any severe reactions post vaccine: Marland Kitchen Difficulty breathing  . Swelling of face and throat  . A fast heartbeat  . A bad rash all over body  . Dizziness and weakness   Immunizations Administered    Name Date Dose VIS Date Route   Moderna COVID-19 Vaccine 06/03/2019 11:31 AM 0.5 mL 02/22/2019 Intramuscular   Manufacturer: Moderna   Lot: [id number]   NDC: 12258-346-21

## 2019-07-05 ENCOUNTER — Ambulatory Visit: Payer: Medicaid Other | Attending: Internal Medicine

## 2019-07-05 DIAGNOSIS — Z23 Encounter for immunization: Secondary | ICD-10-CM

## 2019-07-05 NOTE — Progress Notes (Signed)
   Covid-19 Vaccination Clinic  Name:  Joel Norton    MRN: [id number] DOB: [birth year]  07/05/2019  [name] was observed post Covid-19 immunization for 15 minutes without incident. He was provided with Vaccine Information Sheet and instruction to access the V-Safe system.   [name] was instructed to call 911 with any severe reactions post vaccine: Marland Kitchen Difficulty breathing  . Swelling of face and throat  . A fast heartbeat  . A bad rash all over body  . Dizziness and weakness   Immunizations Administered    Name Date Dose VIS Date Route   Moderna COVID-19 Vaccine 07/05/2019  2:33 PM 0.5 mL 02/22/2019 Intramuscular   Manufacturer: Moderna   Lot: [id number]   NDC: 94076-808-81

## 2020-01-24 ENCOUNTER — Other Ambulatory Visit: Payer: Self-pay | Admitting: Family Medicine

## 2020-01-24 DIAGNOSIS — R7989 Other specified abnormal findings of blood chemistry: Secondary | ICD-10-CM

## 2020-01-27 ENCOUNTER — Ambulatory Visit
Admission: RE | Admit: 2020-01-27 | Discharge: 2020-01-27 | Disposition: A | Discharge status: Home / Self Care | Payer: Medicaid Other | Source: Ambulatory Visit | Attending: Family Medicine | Admitting: Family Medicine

## 2020-01-27 ENCOUNTER — Other Ambulatory Visit: Payer: Self-pay

## 2020-01-27 DIAGNOSIS — R7989 Other specified abnormal findings of blood chemistry: Secondary | ICD-10-CM | POA: Insufficient documentation

## 2022-04-07 ENCOUNTER — Encounter: Payer: Self-pay | Admitting: "Endocrinology

## 2022-05-07 ENCOUNTER — Other Ambulatory Visit: Payer: Self-pay | Admitting: "Endocrinology

## 2022-05-07 DIAGNOSIS — E0500 Thyrotoxicosis with diffuse goiter without thyrotoxic crisis or storm: Secondary | ICD-10-CM

## 2022-05-07 DIAGNOSIS — E0590 Thyrotoxicosis, unspecified without thyrotoxic crisis or storm: Secondary | ICD-10-CM

## 2022-07-17 ENCOUNTER — Encounter
Admission: RE | Admit: 2022-07-17 | Discharge: 2022-07-17 | Disposition: A | Discharge status: Home / Self Care | Payer: Medicaid Other | Source: Ambulatory Visit | Attending: "Endocrinology | Admitting: "Endocrinology

## 2022-07-17 DIAGNOSIS — E0590 Thyrotoxicosis, unspecified without thyrotoxic crisis or storm: Secondary | ICD-10-CM | POA: Insufficient documentation

## 2022-07-17 DIAGNOSIS — E0500 Thyrotoxicosis with diffuse goiter without thyrotoxic crisis or storm: Secondary | ICD-10-CM | POA: Insufficient documentation

## 2022-07-17 MED ORDER — SODIUM IODIDE I-123 7.4 MBQ CAPS
500.0000 | ORAL_CAPSULE | Freq: Once | ORAL | Status: AC
  Administered 2022-07-17: 467 via ORAL

## 2022-07-18 ENCOUNTER — Encounter
Admission: RE | Admit: 2022-07-18 | Discharge: 2022-07-18 | Disposition: A | Discharge status: Home / Self Care | Payer: Medicaid Other | Source: Ambulatory Visit | Attending: "Endocrinology | Admitting: "Endocrinology

## 2022-07-18 IMAGING — US US ABDOMEN LIMITED
1 series · 14 of 25 positions shown · non-contrast
Comparison: Ultrasound abdomen 09/13/1998 report without imaging.

CLINICAL DATA: Elevated liver function tests.

EXAM:
ULTRASOUND ABDOMEN LIMITED RIGHT UPPER QUADRANT

[Series 1: us abdomen limited ruq (liver/gb) · 14 of 36 slices shown]
[im 1/36]
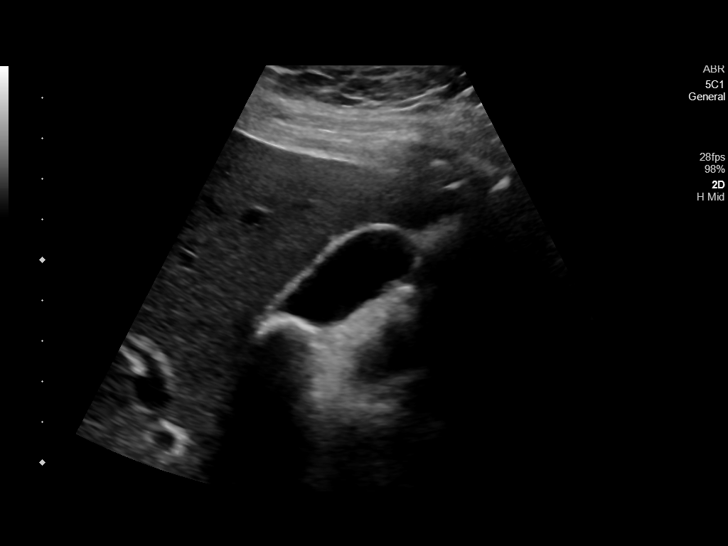
[im 3/36]
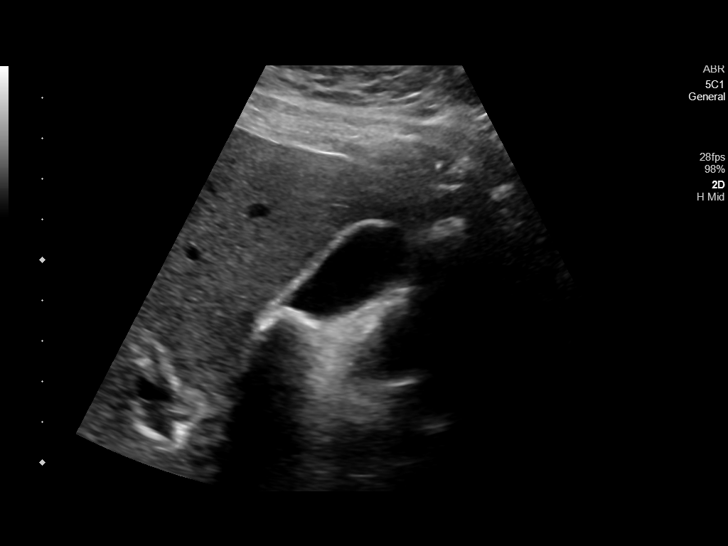
[im 6/36]
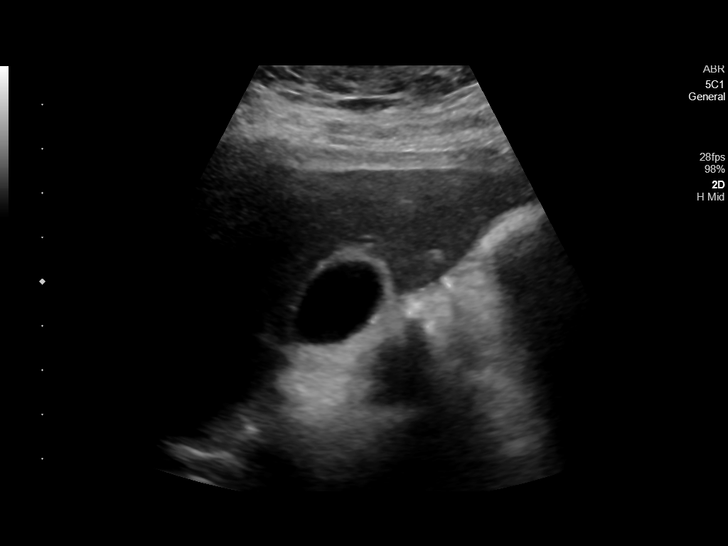
[im 9/36]
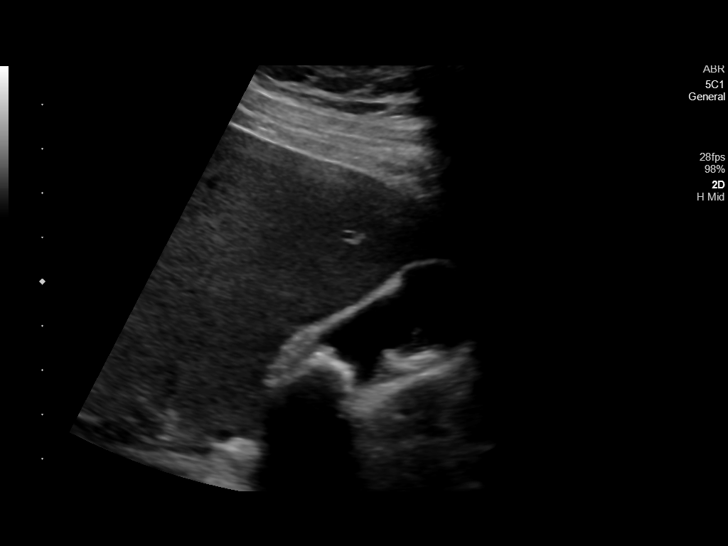
[im 12/36]
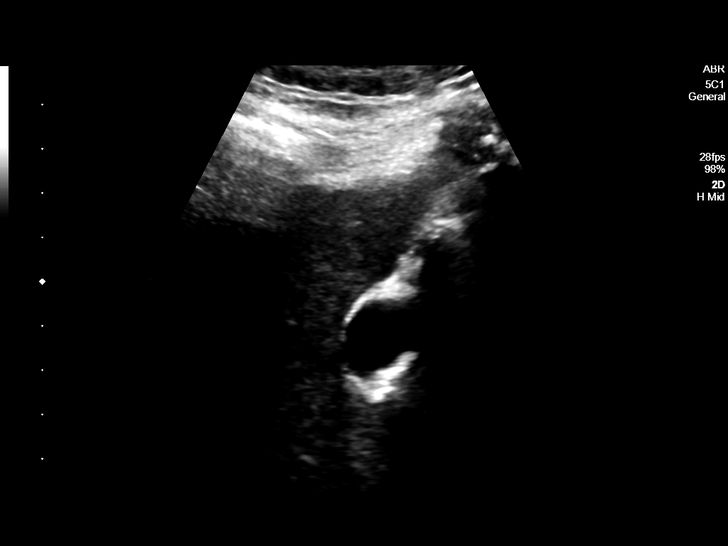
[im 14/36]
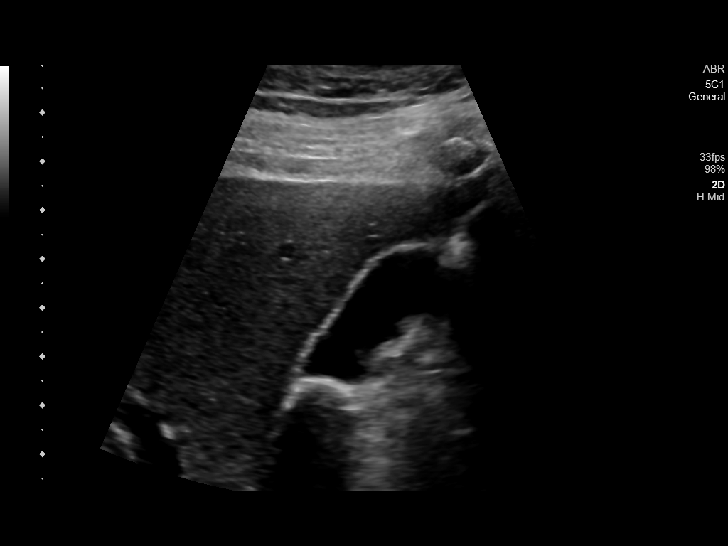
[im 17/36]
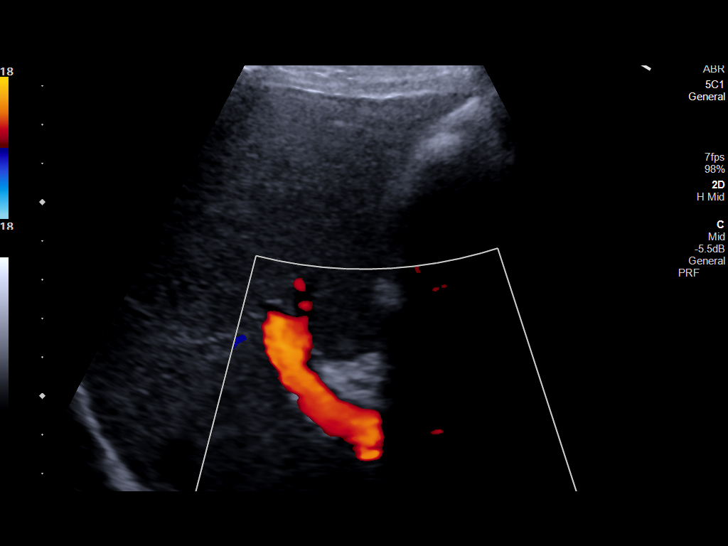
[im 19/36]
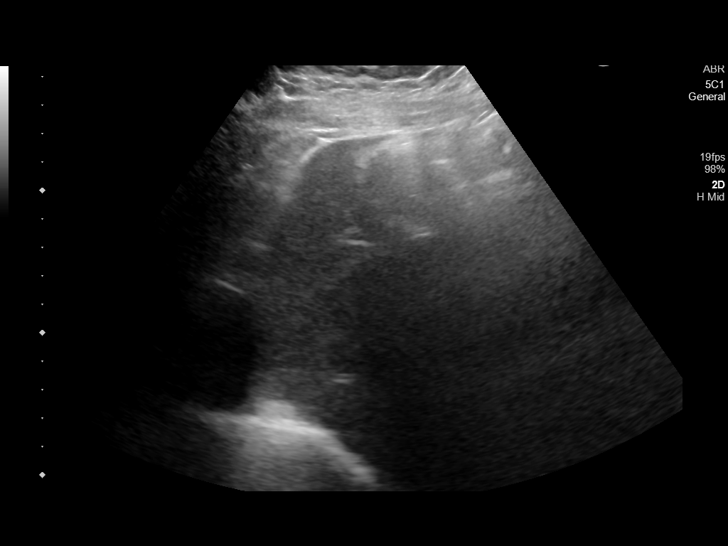
[im 22/36]
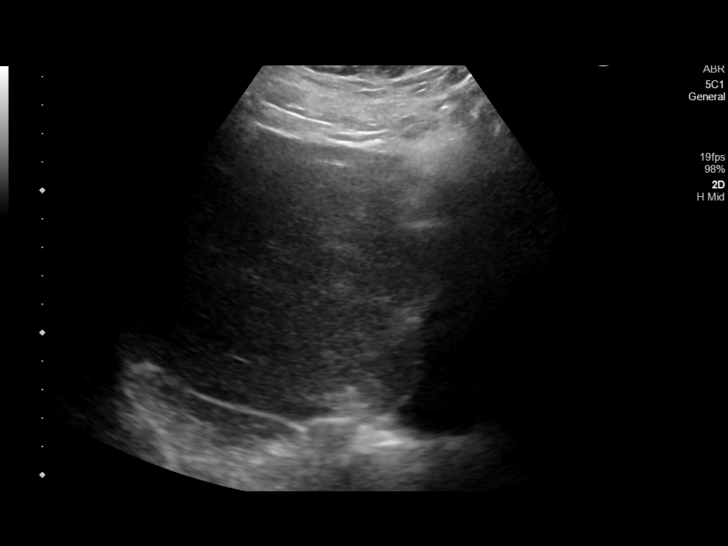
[im 24/36]
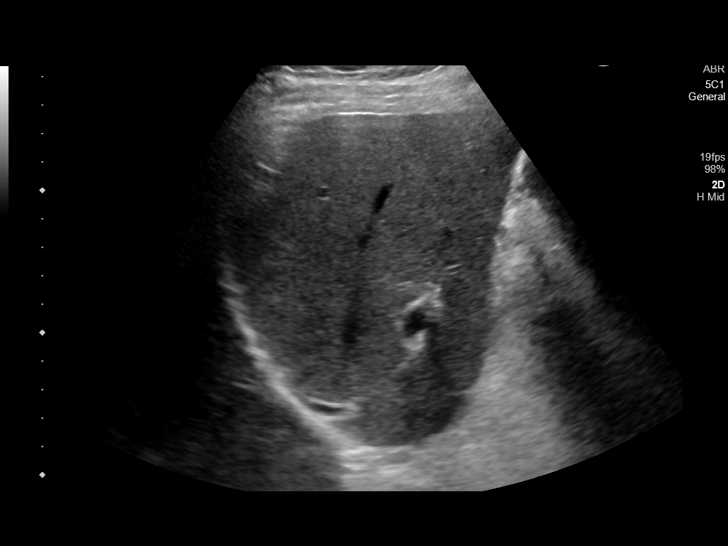
[im 27/36]
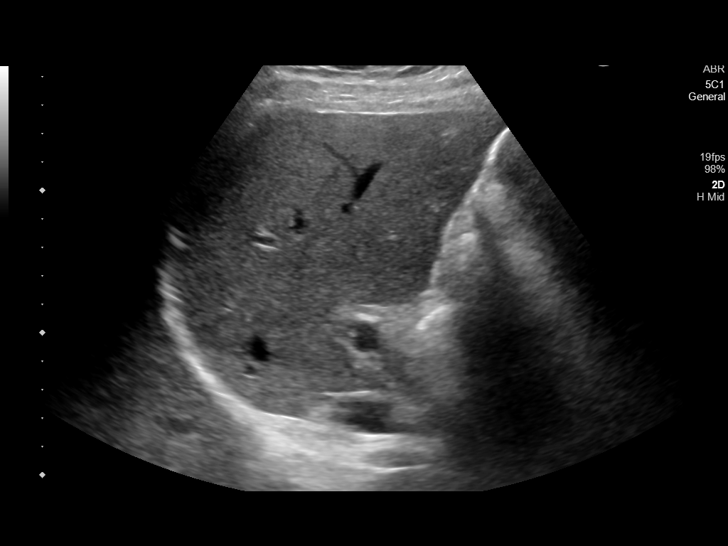
[im 30/36]
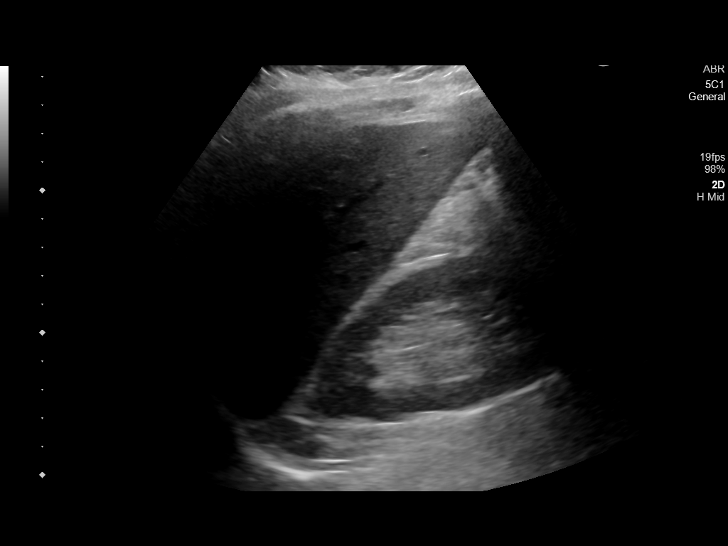
[im 33/36]
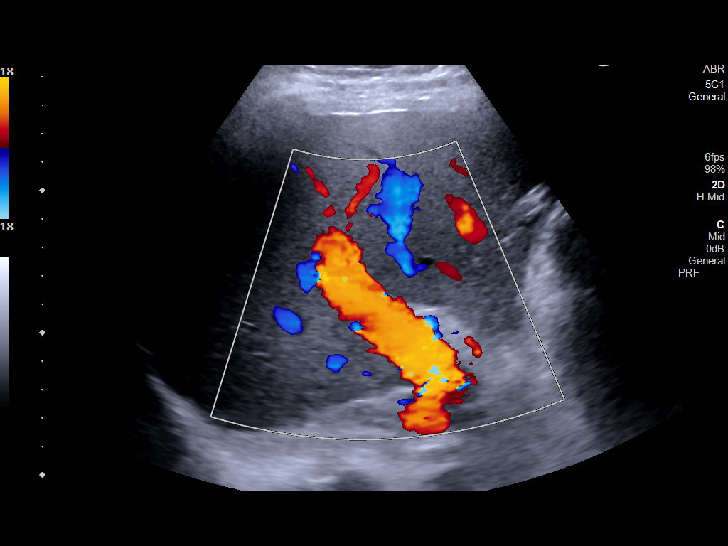
[im 36/36]
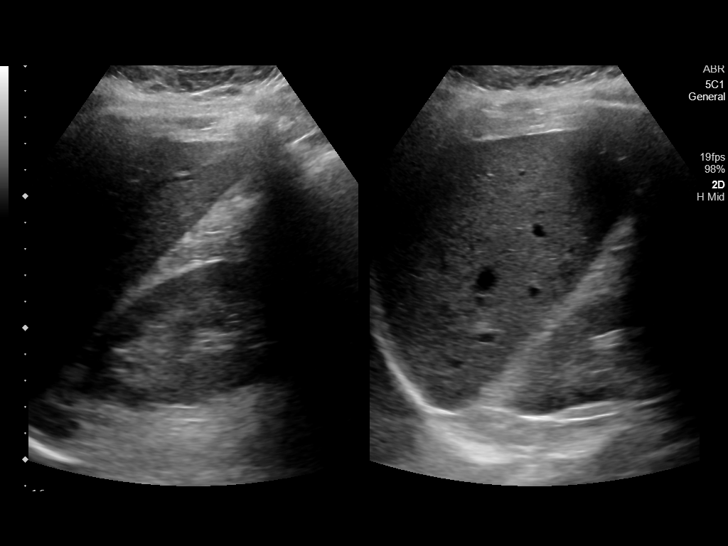

[14 of 25 positions shown; findings below may reference images not displayed]

FINDINGS: Gallbladder:

Several mobile gallstones noted within the gallbladder lumen
measuring up to 1.5 cm. Limited evaluation of the gallbladder neck
is noted. No pericholecystic fluid or wall thickening visualized. No
sonographic Murphy sign noted by sonographer.

Common bile duct:

Diameter: 2 mm

Liver:

No focal lesion identified. Within normal limits in parenchymal
echogenicity. Portal vein is patent on color Doppler imaging with
normal direction of blood flow towards the liver.

Other: None.
IMPRESSION: Cholelithiasis with no findings to suggest acute cholecystitis or
choledocholithiasis.

## 2022-07-21 NOTE — Written Directive (Addendum)
MOLECULAR IMAGING AND THERAPEUTICS WRITTEN DIRECTIVE   PATIENT NAME: Joel Norton  PT DOB:   1960-09-29                                              MRN: [id number]  ---------------------------------------------------------------------------------------------------------------------   I-131 WHOLE THYROID THERAPY (NON-CANCER)    RADIOPHARMACEUTICAL:   Iodine-131 Capsule   Appt:  07/25/2022   PRESCRIBED DOSE FOR ADMINISTRATION: 30 mCi   ROUTE OF ADMINISTRATION: PO   DIAGNOSIS:  Toxic MNG..   REFERRING PHYSICIAN: Dr Verdis Frederickson   TSH:   0.057 06/11/21  @ [facility]      Thyroxine, Free 1.18  03/21/22 [facility]            0.010  10/21/21  @[facility]   No results found for: "TSH"   PRIOR I-131 THERAPY (Date and Dose):   PRIOR RADIOLOGY EXAMS (Results and Date): NM THYROID MULT UPTAKE W/IMAGING  Result Date: 07/18/2022 CLINICAL DATA:  Hyperthyroidism EXAM: THYROID SCAN AND UPTAKE - 4 AND 24 HOURS TECHNIQUE: Following oral administration of I-123 capsule, anterior planar imaging was acquired at 24 hours. Thyroid uptake was calculated with a thyroid probe at 4-6 hours and 24 hours. RADIOPHARMACEUTICALS:  For 6 7 uCi I-123 sodium iodide p.o. COMPARISON:  None available FINDINGS: Asymmetric thyroid lobes RIGHT larger than LEFT. Inhomogeneous tracer uptake in RIGHT lobe likely reflecting small nodules. Pyramidal lobe noted. No dominant hot or cold nodules. 4 hour I-123 uptake = 12.2% (normal 5-20%) 24 hour I-123 uptake = 27.4% (normal 10-30%) IMPRESSION: Normal 4 hour and 24 hour radio iodine uptakes. Probable multinodular thyroid gland. Electronically Signed   By: Ulyses Southward M.D.   On: 07/18/2022 11:09      ADDITIONAL PHYSICIAN COMMENTS/NOTES  Multinodular gland with depressed TSH and normal Iodine uptake suggests toxic multinodular goiter.  Trial of methimazole.  Currently on beta blocker.  Continue beta blocker through therapy  AUTHORIZED USER SIGNATURE & TIME STAMP: Patriciaann Clan, MD    07/21/22    11:15 AM

## 2022-07-25 ENCOUNTER — Encounter
Admission: RE | Admit: 2022-07-25 | Discharge: 2022-07-25 | Disposition: A | Discharge status: Home / Self Care | Payer: Medicaid Other | Source: Ambulatory Visit | Attending: "Endocrinology | Admitting: "Endocrinology

## 2022-07-25 DIAGNOSIS — E0500 Thyrotoxicosis with diffuse goiter without thyrotoxic crisis or storm: Secondary | ICD-10-CM | POA: Diagnosis present

## 2022-07-25 DIAGNOSIS — E0590 Thyrotoxicosis, unspecified without thyrotoxic crisis or storm: Secondary | ICD-10-CM | POA: Insufficient documentation

## 2022-07-25 MED ORDER — SODIUM IODIDE I 131 CAPSULE
30.0300 | Freq: Once | INTRAVENOUS | Status: AC | PRN
  Administered 2022-07-25: 30.03 via ORAL

## 2022-08-01 ENCOUNTER — Ambulatory Visit: Payer: Medicaid Other

## 2023-04-24 DIAGNOSIS — R7989 Other specified abnormal findings of blood chemistry: Secondary | ICD-10-CM | POA: Diagnosis not present

## 2023-04-24 DIAGNOSIS — E0590 Thyrotoxicosis, unspecified without thyrotoxic crisis or storm: Secondary | ICD-10-CM | POA: Diagnosis not present

## 2023-05-08 DIAGNOSIS — Z125 Encounter for screening for malignant neoplasm of prostate: Secondary | ICD-10-CM | POA: Diagnosis not present

## 2023-05-08 DIAGNOSIS — Z7185 Encounter for immunization safety counseling: Secondary | ICD-10-CM | POA: Diagnosis not present

## 2023-05-08 DIAGNOSIS — E669 Obesity, unspecified: Secondary | ICD-10-CM | POA: Diagnosis not present

## 2023-05-08 DIAGNOSIS — R7309 Other abnormal glucose: Secondary | ICD-10-CM | POA: Diagnosis not present

## 2023-05-08 DIAGNOSIS — Z1389 Encounter for screening for other disorder: Secondary | ICD-10-CM | POA: Diagnosis not present

## 2023-05-08 DIAGNOSIS — R32 Unspecified urinary incontinence: Secondary | ICD-10-CM | POA: Diagnosis not present

## 2023-05-08 DIAGNOSIS — I10 Essential (primary) hypertension: Secondary | ICD-10-CM | POA: Diagnosis not present

## 2023-05-08 DIAGNOSIS — Z1211 Encounter for screening for malignant neoplasm of colon: Secondary | ICD-10-CM | POA: Diagnosis not present

## 2023-05-08 DIAGNOSIS — Z23 Encounter for immunization: Secondary | ICD-10-CM | POA: Diagnosis not present

## 2023-05-08 DIAGNOSIS — F79 Unspecified intellectual disabilities: Secondary | ICD-10-CM | POA: Diagnosis not present

## 2023-05-08 DIAGNOSIS — E890 Postprocedural hypothyroidism: Secondary | ICD-10-CM | POA: Diagnosis not present

## 2023-05-08 DIAGNOSIS — Z0131 Encounter for examination of blood pressure with abnormal findings: Secondary | ICD-10-CM | POA: Diagnosis not present

## 2023-06-15 DIAGNOSIS — F79 Unspecified intellectual disabilities: Secondary | ICD-10-CM | POA: Diagnosis not present

## 2023-06-15 DIAGNOSIS — R32 Unspecified urinary incontinence: Secondary | ICD-10-CM | POA: Diagnosis not present

## 2023-08-28 DIAGNOSIS — E0590 Thyrotoxicosis, unspecified without thyrotoxic crisis or storm: Secondary | ICD-10-CM | POA: Diagnosis not present

## 2023-08-28 DIAGNOSIS — R7989 Other specified abnormal findings of blood chemistry: Secondary | ICD-10-CM | POA: Diagnosis not present

## 2023-09-18 DIAGNOSIS — I10 Essential (primary) hypertension: Secondary | ICD-10-CM | POA: Diagnosis not present

## 2023-09-18 DIAGNOSIS — Z0131 Encounter for examination of blood pressure with abnormal findings: Secondary | ICD-10-CM | POA: Diagnosis not present

## 2023-09-18 DIAGNOSIS — Z1389 Encounter for screening for other disorder: Secondary | ICD-10-CM | POA: Diagnosis not present

## 2023-09-18 DIAGNOSIS — Z0130 Encounter for examination of blood pressure without abnormal findings: Secondary | ICD-10-CM | POA: Diagnosis not present

## 2023-09-18 DIAGNOSIS — Z0000 Encounter for general adult medical examination without abnormal findings: Secondary | ICD-10-CM | POA: Diagnosis not present

## 2023-09-18 DIAGNOSIS — R7309 Other abnormal glucose: Secondary | ICD-10-CM | POA: Diagnosis not present

## 2023-09-18 DIAGNOSIS — Z23 Encounter for immunization: Secondary | ICD-10-CM | POA: Diagnosis not present

## 2023-10-09 DIAGNOSIS — E0590 Thyrotoxicosis, unspecified without thyrotoxic crisis or storm: Secondary | ICD-10-CM | POA: Diagnosis not present

## 2023-10-29 DIAGNOSIS — Z7185 Encounter for immunization safety counseling: Secondary | ICD-10-CM | POA: Diagnosis not present

## 2023-10-29 DIAGNOSIS — E669 Obesity, unspecified: Secondary | ICD-10-CM | POA: Diagnosis not present

## 2023-10-29 DIAGNOSIS — I10 Essential (primary) hypertension: Secondary | ICD-10-CM | POA: Diagnosis not present

## 2023-10-29 DIAGNOSIS — F79 Unspecified intellectual disabilities: Secondary | ICD-10-CM | POA: Diagnosis not present

## 2023-10-30 DIAGNOSIS — E669 Obesity, unspecified: Secondary | ICD-10-CM | POA: Diagnosis not present

## 2023-10-30 DIAGNOSIS — F79 Unspecified intellectual disabilities: Secondary | ICD-10-CM | POA: Diagnosis not present

## 2023-10-30 DIAGNOSIS — Z7185 Encounter for immunization safety counseling: Secondary | ICD-10-CM | POA: Diagnosis not present

## 2023-10-30 DIAGNOSIS — I10 Essential (primary) hypertension: Secondary | ICD-10-CM | POA: Diagnosis not present

## 2023-10-31 DIAGNOSIS — Z7185 Encounter for immunization safety counseling: Secondary | ICD-10-CM | POA: Diagnosis not present

## 2023-10-31 DIAGNOSIS — E669 Obesity, unspecified: Secondary | ICD-10-CM | POA: Diagnosis not present

## 2023-10-31 DIAGNOSIS — F79 Unspecified intellectual disabilities: Secondary | ICD-10-CM | POA: Diagnosis not present

## 2023-10-31 DIAGNOSIS — I10 Essential (primary) hypertension: Secondary | ICD-10-CM | POA: Diagnosis not present

## 2023-11-01 DIAGNOSIS — F79 Unspecified intellectual disabilities: Secondary | ICD-10-CM | POA: Diagnosis not present

## 2023-11-01 DIAGNOSIS — Z7185 Encounter for immunization safety counseling: Secondary | ICD-10-CM | POA: Diagnosis not present

## 2023-11-01 DIAGNOSIS — I10 Essential (primary) hypertension: Secondary | ICD-10-CM | POA: Diagnosis not present

## 2023-11-01 DIAGNOSIS — E669 Obesity, unspecified: Secondary | ICD-10-CM | POA: Diagnosis not present

## 2023-11-02 DIAGNOSIS — Z7185 Encounter for immunization safety counseling: Secondary | ICD-10-CM | POA: Diagnosis not present

## 2023-11-02 DIAGNOSIS — E669 Obesity, unspecified: Secondary | ICD-10-CM | POA: Diagnosis not present

## 2023-11-02 DIAGNOSIS — I10 Essential (primary) hypertension: Secondary | ICD-10-CM | POA: Diagnosis not present

## 2023-11-02 DIAGNOSIS — F79 Unspecified intellectual disabilities: Secondary | ICD-10-CM | POA: Diagnosis not present

## 2023-11-03 DIAGNOSIS — I10 Essential (primary) hypertension: Secondary | ICD-10-CM | POA: Diagnosis not present

## 2023-11-03 DIAGNOSIS — E669 Obesity, unspecified: Secondary | ICD-10-CM | POA: Diagnosis not present

## 2023-11-03 DIAGNOSIS — F79 Unspecified intellectual disabilities: Secondary | ICD-10-CM | POA: Diagnosis not present

## 2023-11-03 DIAGNOSIS — Z7185 Encounter for immunization safety counseling: Secondary | ICD-10-CM | POA: Diagnosis not present

## 2023-11-04 DIAGNOSIS — F79 Unspecified intellectual disabilities: Secondary | ICD-10-CM | POA: Diagnosis not present

## 2023-11-04 DIAGNOSIS — Z7185 Encounter for immunization safety counseling: Secondary | ICD-10-CM | POA: Diagnosis not present

## 2023-11-04 DIAGNOSIS — I10 Essential (primary) hypertension: Secondary | ICD-10-CM | POA: Diagnosis not present

## 2023-11-04 DIAGNOSIS — E669 Obesity, unspecified: Secondary | ICD-10-CM | POA: Diagnosis not present

## 2023-11-05 DIAGNOSIS — E669 Obesity, unspecified: Secondary | ICD-10-CM | POA: Diagnosis not present

## 2023-11-05 DIAGNOSIS — F79 Unspecified intellectual disabilities: Secondary | ICD-10-CM | POA: Diagnosis not present

## 2023-11-05 DIAGNOSIS — Z7185 Encounter for immunization safety counseling: Secondary | ICD-10-CM | POA: Diagnosis not present

## 2023-11-05 DIAGNOSIS — I10 Essential (primary) hypertension: Secondary | ICD-10-CM | POA: Diagnosis not present

## 2023-11-06 DIAGNOSIS — E669 Obesity, unspecified: Secondary | ICD-10-CM | POA: Diagnosis not present

## 2023-11-06 DIAGNOSIS — F79 Unspecified intellectual disabilities: Secondary | ICD-10-CM | POA: Diagnosis not present

## 2023-11-06 DIAGNOSIS — Z7185 Encounter for immunization safety counseling: Secondary | ICD-10-CM | POA: Diagnosis not present

## 2023-11-06 DIAGNOSIS — I10 Essential (primary) hypertension: Secondary | ICD-10-CM | POA: Diagnosis not present

## 2023-11-07 DIAGNOSIS — F79 Unspecified intellectual disabilities: Secondary | ICD-10-CM | POA: Diagnosis not present

## 2023-11-07 DIAGNOSIS — E669 Obesity, unspecified: Secondary | ICD-10-CM | POA: Diagnosis not present

## 2023-11-07 DIAGNOSIS — I10 Essential (primary) hypertension: Secondary | ICD-10-CM | POA: Diagnosis not present

## 2023-11-07 DIAGNOSIS — Z7185 Encounter for immunization safety counseling: Secondary | ICD-10-CM | POA: Diagnosis not present

## 2023-11-08 DIAGNOSIS — E669 Obesity, unspecified: Secondary | ICD-10-CM | POA: Diagnosis not present

## 2023-11-08 DIAGNOSIS — F79 Unspecified intellectual disabilities: Secondary | ICD-10-CM | POA: Diagnosis not present

## 2023-11-08 DIAGNOSIS — I10 Essential (primary) hypertension: Secondary | ICD-10-CM | POA: Diagnosis not present

## 2023-11-08 DIAGNOSIS — Z7185 Encounter for immunization safety counseling: Secondary | ICD-10-CM | POA: Diagnosis not present

## 2023-11-09 DIAGNOSIS — Z7185 Encounter for immunization safety counseling: Secondary | ICD-10-CM | POA: Diagnosis not present

## 2023-11-09 DIAGNOSIS — E669 Obesity, unspecified: Secondary | ICD-10-CM | POA: Diagnosis not present

## 2023-11-09 DIAGNOSIS — F79 Unspecified intellectual disabilities: Secondary | ICD-10-CM | POA: Diagnosis not present

## 2023-11-09 DIAGNOSIS — I10 Essential (primary) hypertension: Secondary | ICD-10-CM | POA: Diagnosis not present

## 2023-11-10 DIAGNOSIS — Z7185 Encounter for immunization safety counseling: Secondary | ICD-10-CM | POA: Diagnosis not present

## 2023-11-10 DIAGNOSIS — I10 Essential (primary) hypertension: Secondary | ICD-10-CM | POA: Diagnosis not present

## 2023-11-10 DIAGNOSIS — E669 Obesity, unspecified: Secondary | ICD-10-CM | POA: Diagnosis not present

## 2023-11-10 DIAGNOSIS — F79 Unspecified intellectual disabilities: Secondary | ICD-10-CM | POA: Diagnosis not present

## 2023-11-11 DIAGNOSIS — F79 Unspecified intellectual disabilities: Secondary | ICD-10-CM | POA: Diagnosis not present

## 2023-11-11 DIAGNOSIS — Z7185 Encounter for immunization safety counseling: Secondary | ICD-10-CM | POA: Diagnosis not present

## 2023-11-11 DIAGNOSIS — E669 Obesity, unspecified: Secondary | ICD-10-CM | POA: Diagnosis not present

## 2023-11-11 DIAGNOSIS — I10 Essential (primary) hypertension: Secondary | ICD-10-CM | POA: Diagnosis not present

## 2023-11-12 DIAGNOSIS — F79 Unspecified intellectual disabilities: Secondary | ICD-10-CM | POA: Diagnosis not present

## 2023-11-12 DIAGNOSIS — I10 Essential (primary) hypertension: Secondary | ICD-10-CM | POA: Diagnosis not present

## 2023-11-12 DIAGNOSIS — Z7185 Encounter for immunization safety counseling: Secondary | ICD-10-CM | POA: Diagnosis not present

## 2023-11-12 DIAGNOSIS — E669 Obesity, unspecified: Secondary | ICD-10-CM | POA: Diagnosis not present

## 2023-11-13 DIAGNOSIS — E669 Obesity, unspecified: Secondary | ICD-10-CM | POA: Diagnosis not present

## 2023-11-13 DIAGNOSIS — Z7185 Encounter for immunization safety counseling: Secondary | ICD-10-CM | POA: Diagnosis not present

## 2023-11-13 DIAGNOSIS — F79 Unspecified intellectual disabilities: Secondary | ICD-10-CM | POA: Diagnosis not present

## 2023-11-13 DIAGNOSIS — I10 Essential (primary) hypertension: Secondary | ICD-10-CM | POA: Diagnosis not present

## 2023-11-14 DIAGNOSIS — E669 Obesity, unspecified: Secondary | ICD-10-CM | POA: Diagnosis not present

## 2023-11-14 DIAGNOSIS — F79 Unspecified intellectual disabilities: Secondary | ICD-10-CM | POA: Diagnosis not present

## 2023-11-14 DIAGNOSIS — I10 Essential (primary) hypertension: Secondary | ICD-10-CM | POA: Diagnosis not present

## 2023-11-14 DIAGNOSIS — Z7185 Encounter for immunization safety counseling: Secondary | ICD-10-CM | POA: Diagnosis not present

## 2023-11-15 DIAGNOSIS — F79 Unspecified intellectual disabilities: Secondary | ICD-10-CM | POA: Diagnosis not present

## 2023-11-15 DIAGNOSIS — E669 Obesity, unspecified: Secondary | ICD-10-CM | POA: Diagnosis not present

## 2023-11-15 DIAGNOSIS — Z7185 Encounter for immunization safety counseling: Secondary | ICD-10-CM | POA: Diagnosis not present

## 2023-11-15 DIAGNOSIS — I10 Essential (primary) hypertension: Secondary | ICD-10-CM | POA: Diagnosis not present

## 2023-11-16 DIAGNOSIS — E669 Obesity, unspecified: Secondary | ICD-10-CM | POA: Diagnosis not present

## 2023-11-16 DIAGNOSIS — F79 Unspecified intellectual disabilities: Secondary | ICD-10-CM | POA: Diagnosis not present

## 2023-11-16 DIAGNOSIS — Z7185 Encounter for immunization safety counseling: Secondary | ICD-10-CM | POA: Diagnosis not present

## 2023-11-16 DIAGNOSIS — I10 Essential (primary) hypertension: Secondary | ICD-10-CM | POA: Diagnosis not present

## 2023-11-17 DIAGNOSIS — I10 Essential (primary) hypertension: Secondary | ICD-10-CM | POA: Diagnosis not present

## 2023-11-17 DIAGNOSIS — Z7185 Encounter for immunization safety counseling: Secondary | ICD-10-CM | POA: Diagnosis not present

## 2023-11-17 DIAGNOSIS — E669 Obesity, unspecified: Secondary | ICD-10-CM | POA: Diagnosis not present

## 2023-11-17 DIAGNOSIS — F79 Unspecified intellectual disabilities: Secondary | ICD-10-CM | POA: Diagnosis not present

## 2023-11-18 DIAGNOSIS — I10 Essential (primary) hypertension: Secondary | ICD-10-CM | POA: Diagnosis not present

## 2023-11-18 DIAGNOSIS — Z7185 Encounter for immunization safety counseling: Secondary | ICD-10-CM | POA: Diagnosis not present

## 2023-11-18 DIAGNOSIS — F79 Unspecified intellectual disabilities: Secondary | ICD-10-CM | POA: Diagnosis not present

## 2023-11-18 DIAGNOSIS — E669 Obesity, unspecified: Secondary | ICD-10-CM | POA: Diagnosis not present

## 2023-11-19 DIAGNOSIS — Z7185 Encounter for immunization safety counseling: Secondary | ICD-10-CM | POA: Diagnosis not present

## 2023-11-19 DIAGNOSIS — E669 Obesity, unspecified: Secondary | ICD-10-CM | POA: Diagnosis not present

## 2023-11-19 DIAGNOSIS — I10 Essential (primary) hypertension: Secondary | ICD-10-CM | POA: Diagnosis not present

## 2023-11-19 DIAGNOSIS — F79 Unspecified intellectual disabilities: Secondary | ICD-10-CM | POA: Diagnosis not present

## 2023-11-20 DIAGNOSIS — I10 Essential (primary) hypertension: Secondary | ICD-10-CM | POA: Diagnosis not present

## 2023-11-20 DIAGNOSIS — Z7185 Encounter for immunization safety counseling: Secondary | ICD-10-CM | POA: Diagnosis not present

## 2023-11-20 DIAGNOSIS — E669 Obesity, unspecified: Secondary | ICD-10-CM | POA: Diagnosis not present

## 2023-11-20 DIAGNOSIS — F79 Unspecified intellectual disabilities: Secondary | ICD-10-CM | POA: Diagnosis not present

## 2023-11-21 DIAGNOSIS — E669 Obesity, unspecified: Secondary | ICD-10-CM | POA: Diagnosis not present

## 2023-11-21 DIAGNOSIS — Z7185 Encounter for immunization safety counseling: Secondary | ICD-10-CM | POA: Diagnosis not present

## 2023-11-21 DIAGNOSIS — I10 Essential (primary) hypertension: Secondary | ICD-10-CM | POA: Diagnosis not present

## 2023-11-21 DIAGNOSIS — F79 Unspecified intellectual disabilities: Secondary | ICD-10-CM | POA: Diagnosis not present

## 2023-11-22 DIAGNOSIS — E669 Obesity, unspecified: Secondary | ICD-10-CM | POA: Diagnosis not present

## 2023-11-22 DIAGNOSIS — Z7185 Encounter for immunization safety counseling: Secondary | ICD-10-CM | POA: Diagnosis not present

## 2023-11-22 DIAGNOSIS — F79 Unspecified intellectual disabilities: Secondary | ICD-10-CM | POA: Diagnosis not present

## 2023-11-22 DIAGNOSIS — I10 Essential (primary) hypertension: Secondary | ICD-10-CM | POA: Diagnosis not present

## 2023-11-23 DIAGNOSIS — E890 Postprocedural hypothyroidism: Secondary | ICD-10-CM | POA: Diagnosis not present

## 2023-11-24 DIAGNOSIS — E890 Postprocedural hypothyroidism: Secondary | ICD-10-CM | POA: Diagnosis not present

## 2023-11-25 DIAGNOSIS — E890 Postprocedural hypothyroidism: Secondary | ICD-10-CM | POA: Diagnosis not present

## 2023-11-26 DIAGNOSIS — E890 Postprocedural hypothyroidism: Secondary | ICD-10-CM | POA: Diagnosis not present

## 2023-11-27 DIAGNOSIS — E890 Postprocedural hypothyroidism: Secondary | ICD-10-CM | POA: Diagnosis not present

## 2023-11-28 DIAGNOSIS — E890 Postprocedural hypothyroidism: Secondary | ICD-10-CM | POA: Diagnosis not present

## 2023-11-29 DIAGNOSIS — E890 Postprocedural hypothyroidism: Secondary | ICD-10-CM | POA: Diagnosis not present

## 2023-11-30 DIAGNOSIS — E890 Postprocedural hypothyroidism: Secondary | ICD-10-CM | POA: Diagnosis not present

## 2023-12-01 DIAGNOSIS — E890 Postprocedural hypothyroidism: Secondary | ICD-10-CM | POA: Diagnosis not present

## 2023-12-02 DIAGNOSIS — E890 Postprocedural hypothyroidism: Secondary | ICD-10-CM | POA: Diagnosis not present

## 2023-12-03 DIAGNOSIS — E890 Postprocedural hypothyroidism: Secondary | ICD-10-CM | POA: Diagnosis not present

## 2023-12-04 DIAGNOSIS — E890 Postprocedural hypothyroidism: Secondary | ICD-10-CM | POA: Diagnosis not present

## 2023-12-05 DIAGNOSIS — E890 Postprocedural hypothyroidism: Secondary | ICD-10-CM | POA: Diagnosis not present

## 2023-12-06 DIAGNOSIS — E890 Postprocedural hypothyroidism: Secondary | ICD-10-CM | POA: Diagnosis not present

## 2023-12-07 DIAGNOSIS — E890 Postprocedural hypothyroidism: Secondary | ICD-10-CM | POA: Diagnosis not present

## 2023-12-08 DIAGNOSIS — E890 Postprocedural hypothyroidism: Secondary | ICD-10-CM | POA: Diagnosis not present

## 2023-12-09 DIAGNOSIS — E890 Postprocedural hypothyroidism: Secondary | ICD-10-CM | POA: Diagnosis not present

## 2023-12-10 DIAGNOSIS — E890 Postprocedural hypothyroidism: Secondary | ICD-10-CM | POA: Diagnosis not present

## 2023-12-11 DIAGNOSIS — E890 Postprocedural hypothyroidism: Secondary | ICD-10-CM | POA: Diagnosis not present

## 2023-12-12 DIAGNOSIS — E890 Postprocedural hypothyroidism: Secondary | ICD-10-CM | POA: Diagnosis not present

## 2023-12-13 DIAGNOSIS — E890 Postprocedural hypothyroidism: Secondary | ICD-10-CM | POA: Diagnosis not present

## 2023-12-14 DIAGNOSIS — E890 Postprocedural hypothyroidism: Secondary | ICD-10-CM | POA: Diagnosis not present

## 2023-12-15 DIAGNOSIS — E890 Postprocedural hypothyroidism: Secondary | ICD-10-CM | POA: Diagnosis not present

## 2023-12-16 DIAGNOSIS — E890 Postprocedural hypothyroidism: Secondary | ICD-10-CM | POA: Diagnosis not present

## 2023-12-17 DIAGNOSIS — E890 Postprocedural hypothyroidism: Secondary | ICD-10-CM | POA: Diagnosis not present

## 2023-12-18 DIAGNOSIS — E890 Postprocedural hypothyroidism: Secondary | ICD-10-CM | POA: Diagnosis not present

## 2023-12-19 DIAGNOSIS — E890 Postprocedural hypothyroidism: Secondary | ICD-10-CM | POA: Diagnosis not present

## 2023-12-20 DIAGNOSIS — E890 Postprocedural hypothyroidism: Secondary | ICD-10-CM | POA: Diagnosis not present

## 2023-12-21 DIAGNOSIS — E890 Postprocedural hypothyroidism: Secondary | ICD-10-CM | POA: Diagnosis not present

## 2023-12-22 DIAGNOSIS — E890 Postprocedural hypothyroidism: Secondary | ICD-10-CM | POA: Diagnosis not present

## 2023-12-23 DIAGNOSIS — E890 Postprocedural hypothyroidism: Secondary | ICD-10-CM | POA: Diagnosis not present

## 2023-12-24 DIAGNOSIS — E890 Postprocedural hypothyroidism: Secondary | ICD-10-CM | POA: Diagnosis not present

## 2023-12-25 DIAGNOSIS — E890 Postprocedural hypothyroidism: Secondary | ICD-10-CM | POA: Diagnosis not present

## 2023-12-26 DIAGNOSIS — E890 Postprocedural hypothyroidism: Secondary | ICD-10-CM | POA: Diagnosis not present

## 2023-12-27 DIAGNOSIS — E890 Postprocedural hypothyroidism: Secondary | ICD-10-CM | POA: Diagnosis not present

## 2023-12-28 DIAGNOSIS — E890 Postprocedural hypothyroidism: Secondary | ICD-10-CM | POA: Diagnosis not present

## 2023-12-29 DIAGNOSIS — E890 Postprocedural hypothyroidism: Secondary | ICD-10-CM | POA: Diagnosis not present

## 2023-12-30 DIAGNOSIS — E890 Postprocedural hypothyroidism: Secondary | ICD-10-CM | POA: Diagnosis not present

## 2023-12-31 DIAGNOSIS — E890 Postprocedural hypothyroidism: Secondary | ICD-10-CM | POA: Diagnosis not present

## 2024-01-01 DIAGNOSIS — E890 Postprocedural hypothyroidism: Secondary | ICD-10-CM | POA: Diagnosis not present

## 2024-01-02 DIAGNOSIS — Z741 Need for assistance with personal care: Secondary | ICD-10-CM | POA: Diagnosis not present

## 2024-01-03 DIAGNOSIS — Z741 Need for assistance with personal care: Secondary | ICD-10-CM | POA: Diagnosis not present

## 2024-01-05 DIAGNOSIS — Z741 Need for assistance with personal care: Secondary | ICD-10-CM | POA: Diagnosis not present

## 2024-01-06 DIAGNOSIS — Z741 Need for assistance with personal care: Secondary | ICD-10-CM | POA: Diagnosis not present

## 2024-01-07 DIAGNOSIS — Z741 Need for assistance with personal care: Secondary | ICD-10-CM | POA: Diagnosis not present

## 2024-01-09 DIAGNOSIS — Z741 Need for assistance with personal care: Secondary | ICD-10-CM | POA: Diagnosis not present

## 2024-01-10 DIAGNOSIS — Z741 Need for assistance with personal care: Secondary | ICD-10-CM | POA: Diagnosis not present

## 2024-01-12 DIAGNOSIS — Z741 Need for assistance with personal care: Secondary | ICD-10-CM | POA: Diagnosis not present

## 2024-01-13 DIAGNOSIS — Z741 Need for assistance with personal care: Secondary | ICD-10-CM | POA: Diagnosis not present

## 2024-01-14 DIAGNOSIS — Z741 Need for assistance with personal care: Secondary | ICD-10-CM | POA: Diagnosis not present

## 2024-01-16 DIAGNOSIS — Z741 Need for assistance with personal care: Secondary | ICD-10-CM | POA: Diagnosis not present

## 2024-01-17 DIAGNOSIS — Z741 Need for assistance with personal care: Secondary | ICD-10-CM | POA: Diagnosis not present

## 2024-01-19 DIAGNOSIS — Z741 Need for assistance with personal care: Secondary | ICD-10-CM | POA: Diagnosis not present

## 2024-01-20 DIAGNOSIS — Z741 Need for assistance with personal care: Secondary | ICD-10-CM | POA: Diagnosis not present

## 2024-01-21 DIAGNOSIS — Z741 Need for assistance with personal care: Secondary | ICD-10-CM | POA: Diagnosis not present

## 2024-01-23 DIAGNOSIS — Z741 Need for assistance with personal care: Secondary | ICD-10-CM | POA: Diagnosis not present

## 2024-01-26 DIAGNOSIS — Z741 Need for assistance with personal care: Secondary | ICD-10-CM | POA: Diagnosis not present

## 2024-01-27 DIAGNOSIS — Z741 Need for assistance with personal care: Secondary | ICD-10-CM | POA: Diagnosis not present

## 2024-01-28 DIAGNOSIS — Z741 Need for assistance with personal care: Secondary | ICD-10-CM | POA: Diagnosis not present

## 2024-01-30 DIAGNOSIS — Z741 Need for assistance with personal care: Secondary | ICD-10-CM | POA: Diagnosis not present

## 2024-02-02 DIAGNOSIS — Z741 Need for assistance with personal care: Secondary | ICD-10-CM | POA: Diagnosis not present

## 2024-02-03 DIAGNOSIS — Z741 Need for assistance with personal care: Secondary | ICD-10-CM | POA: Diagnosis not present

## 2024-02-04 DIAGNOSIS — Z741 Need for assistance with personal care: Secondary | ICD-10-CM | POA: Diagnosis not present

## 2024-02-06 DIAGNOSIS — Z741 Need for assistance with personal care: Secondary | ICD-10-CM | POA: Diagnosis not present

## 2024-02-07 DIAGNOSIS — Z741 Need for assistance with personal care: Secondary | ICD-10-CM | POA: Diagnosis not present

## 2024-02-09 DIAGNOSIS — Z741 Need for assistance with personal care: Secondary | ICD-10-CM | POA: Diagnosis not present

## 2024-02-10 DIAGNOSIS — Z741 Need for assistance with personal care: Secondary | ICD-10-CM | POA: Diagnosis not present

## 2024-02-11 DIAGNOSIS — Z741 Need for assistance with personal care: Secondary | ICD-10-CM | POA: Diagnosis not present

## 2024-02-13 DIAGNOSIS — Z741 Need for assistance with personal care: Secondary | ICD-10-CM | POA: Diagnosis not present

## 2024-02-14 DIAGNOSIS — Z741 Need for assistance with personal care: Secondary | ICD-10-CM | POA: Diagnosis not present

## 2024-02-16 DIAGNOSIS — Z741 Need for assistance with personal care: Secondary | ICD-10-CM | POA: Diagnosis not present

## 2024-02-17 DIAGNOSIS — Z741 Need for assistance with personal care: Secondary | ICD-10-CM | POA: Diagnosis not present

## 2024-02-18 DIAGNOSIS — Z741 Need for assistance with personal care: Secondary | ICD-10-CM | POA: Diagnosis not present

## 2024-02-20 DIAGNOSIS — Z741 Need for assistance with personal care: Secondary | ICD-10-CM | POA: Diagnosis not present

## 2024-02-21 DIAGNOSIS — Z741 Need for assistance with personal care: Secondary | ICD-10-CM | POA: Diagnosis not present

## 2024-02-23 DIAGNOSIS — Z741 Need for assistance with personal care: Secondary | ICD-10-CM | POA: Diagnosis not present

## 2024-02-24 DIAGNOSIS — Z741 Need for assistance with personal care: Secondary | ICD-10-CM | POA: Diagnosis not present

## 2024-02-25 DIAGNOSIS — Z741 Need for assistance with personal care: Secondary | ICD-10-CM | POA: Diagnosis not present

## 2024-02-27 DIAGNOSIS — Z741 Need for assistance with personal care: Secondary | ICD-10-CM | POA: Diagnosis not present

## 2024-02-28 DIAGNOSIS — Z741 Need for assistance with personal care: Secondary | ICD-10-CM | POA: Diagnosis not present

## 2024-03-01 DIAGNOSIS — Z741 Need for assistance with personal care: Secondary | ICD-10-CM | POA: Diagnosis not present

## 2024-03-02 DIAGNOSIS — Z741 Need for assistance with personal care: Secondary | ICD-10-CM | POA: Diagnosis not present

## 2024-03-03 DIAGNOSIS — Z741 Need for assistance with personal care: Secondary | ICD-10-CM | POA: Diagnosis not present

## 2024-03-05 DIAGNOSIS — Z741 Need for assistance with personal care: Secondary | ICD-10-CM | POA: Diagnosis not present

## 2024-03-06 DIAGNOSIS — Z741 Need for assistance with personal care: Secondary | ICD-10-CM | POA: Diagnosis not present

## 2024-03-08 DIAGNOSIS — Z741 Need for assistance with personal care: Secondary | ICD-10-CM | POA: Diagnosis not present

## 2024-03-10 DIAGNOSIS — Z741 Need for assistance with personal care: Secondary | ICD-10-CM | POA: Diagnosis not present

## 2024-03-12 DIAGNOSIS — Z741 Need for assistance with personal care: Secondary | ICD-10-CM | POA: Diagnosis not present

## 2024-03-13 DIAGNOSIS — Z741 Need for assistance with personal care: Secondary | ICD-10-CM | POA: Diagnosis not present

## 2024-03-15 DIAGNOSIS — Z741 Need for assistance with personal care: Secondary | ICD-10-CM | POA: Diagnosis not present

## 2024-03-16 DIAGNOSIS — Z741 Need for assistance with personal care: Secondary | ICD-10-CM | POA: Diagnosis not present
